# Patient Record
Sex: Female | Born: 1945 | Race: White | Hispanic: No | Smoking: Never smoker
Health system: Southern US, Community
[De-identification: ages and names within clinical notes are randomized; demographics above are authoritative.]

## PROBLEM LIST (undated history)

## (undated) DIAGNOSIS — K219 Gastro-esophageal reflux disease without esophagitis: Secondary | ICD-10-CM

## (undated) DIAGNOSIS — E7800 Pure hypercholesterolemia, unspecified: Secondary | ICD-10-CM

## (undated) DIAGNOSIS — F419 Anxiety disorder, unspecified: Secondary | ICD-10-CM

## (undated) DIAGNOSIS — I10 Essential (primary) hypertension: Secondary | ICD-10-CM

## (undated) DIAGNOSIS — M7541 Impingement syndrome of right shoulder: Secondary | ICD-10-CM

## (undated) HISTORY — PX: TUBAL LIGATION: SHX77

## (undated) HISTORY — PX: BACK SURGERY: SHX140

---

## 1998-11-15 ENCOUNTER — Ambulatory Visit (HOSPITAL_COMMUNITY): Admission: RE | Admit: 1998-11-15 | Discharge: 1998-11-15 | Payer: Self-pay | Admitting: Obstetrics and Gynecology

## 2004-07-18 ENCOUNTER — Encounter: Admission: RE | Admit: 2004-07-18 | Discharge: 2004-07-18 | Payer: Self-pay | Admitting: Orthopedic Surgery

## 2004-09-13 ENCOUNTER — Encounter: Admission: RE | Admit: 2004-09-13 | Discharge: 2004-09-13 | Payer: Self-pay | Admitting: Internal Medicine

## 2005-12-04 ENCOUNTER — Encounter: Admission: RE | Admit: 2005-12-04 | Discharge: 2005-12-04 | Payer: Self-pay | Admitting: Interventional Radiology

## 2007-01-13 ENCOUNTER — Emergency Department (HOSPITAL_COMMUNITY): Admission: EM | Admit: 2007-01-13 | Discharge: 2007-01-13 | Payer: Self-pay | Admitting: Emergency Medicine

## 2007-02-02 ENCOUNTER — Emergency Department (HOSPITAL_COMMUNITY): Admission: EM | Admit: 2007-02-02 | Discharge: 2007-02-02 | Payer: Self-pay | Admitting: Emergency Medicine

## 2007-12-17 ENCOUNTER — Encounter: Admission: RE | Admit: 2007-12-17 | Discharge: 2007-12-17 | Payer: Self-pay | Admitting: Gastroenterology

## 2008-01-14 ENCOUNTER — Encounter: Admission: RE | Admit: 2008-01-14 | Discharge: 2008-01-14 | Payer: Self-pay | Admitting: Gastroenterology

## 2009-01-02 ENCOUNTER — Emergency Department (HOSPITAL_COMMUNITY): Admission: EM | Admit: 2009-01-02 | Discharge: 2009-01-02 | Payer: Self-pay | Admitting: Emergency Medicine

## 2009-03-03 ENCOUNTER — Emergency Department (HOSPITAL_COMMUNITY): Admission: EM | Admit: 2009-03-03 | Discharge: 2009-03-03 | Payer: Self-pay | Admitting: Emergency Medicine

## 2009-05-07 ENCOUNTER — Encounter
Admission: RE | Admit: 2009-05-07 | Discharge: 2009-05-07 | Payer: Self-pay | Admitting: Physical Medicine and Rehabilitation

## 2009-05-25 ENCOUNTER — Emergency Department (HOSPITAL_COMMUNITY): Admission: EM | Admit: 2009-05-25 | Discharge: 2009-05-25 | Payer: Self-pay | Admitting: Emergency Medicine

## 2009-07-13 ENCOUNTER — Ambulatory Visit (HOSPITAL_COMMUNITY): Admission: RE | Admit: 2009-07-13 | Discharge: 2009-07-16 | Payer: Self-pay | Admitting: Orthopedic Surgery

## 2009-07-13 ENCOUNTER — Encounter (INDEPENDENT_AMBULATORY_CARE_PROVIDER_SITE_OTHER): Payer: Self-pay | Admitting: Orthopedic Surgery

## 2010-07-20 ENCOUNTER — Encounter: Payer: Self-pay | Admitting: Orthopedic Surgery

## 2010-10-02 LAB — URINALYSIS, ROUTINE W REFLEX MICROSCOPIC
Nitrite: NEGATIVE
Protein, ur: NEGATIVE mg/dL
Urobilinogen, UA: 0.2 mg/dL (ref 0.0–1.0)

## 2010-10-06 LAB — DIFFERENTIAL
Basophils Absolute: 0 10*3/uL (ref 0.0–0.1)
Basophils Relative: 0 % (ref 0–1)
Eosinophils Absolute: 0.1 K/uL (ref 0.0–0.7)
Eosinophils Relative: 1 % (ref 0–5)
Lymphocytes Relative: 28 % (ref 12–46)
Lymphs Abs: 2.2 K/uL (ref 0.7–4.0)
Monocytes Absolute: 0.5 K/uL (ref 0.1–1.0)
Monocytes Relative: 6 % (ref 3–12)
Neutro Abs: 5.1 10*3/uL (ref 1.7–7.7)
Neutrophils Relative %: 65 % (ref 43–77)

## 2010-10-06 LAB — BASIC METABOLIC PANEL
CO2: 25 mEq/L (ref 19–32)
Calcium: 9.3 mg/dL (ref 8.4–10.5)
Creatinine, Ser: 0.66 mg/dL (ref 0.4–1.2)
GFR calc Af Amer: >60 mL/min (ref >60)
GFR calc non Af Amer: >60 mL/min (ref >60)

## 2010-10-06 LAB — CBC
HCT: 36.3 % (ref 36.0–46.0)
Hemoglobin: 12.2 g/dL (ref 12.0–15.0)
MCHC: 33.7 g/dL (ref 30.0–36.0)
MCV: 92.1 fL (ref 78.0–100.0)
Platelets: 178 K/uL (ref 150–400)
RBC: 3.94 MIL/uL (ref 3.87–5.11)
RDW: 12.8 % (ref 11.5–15.5)
WBC: 7.8 10*3/uL (ref 4.0–10.5)

## 2010-10-06 LAB — BASIC METABOLIC PANEL WITH GFR
BUN: 10 mg/dL (ref 6–23)
Chloride: 102 meq/L (ref 96–112)
Glucose, Bld: 110 mg/dL — ABNORMAL HIGH (ref 70–99)
Potassium: 3.6 meq/L (ref 3.5–5.1)
Sodium: 138 meq/L (ref 135–145)

## 2010-10-06 LAB — POCT CARDIAC MARKERS
CKMB, poc: <1 ng/mL — ABNORMAL LOW (ref 1.0–8.0)
Myoglobin, poc: 43.9 ng/mL (ref 12–200)
Troponin i, poc: <0.05 ng/mL (ref 0.00–0.09)

## 2011-04-14 LAB — DIFFERENTIAL
Basophils Absolute: 0
Basophils Absolute: 0
Basophils Relative: 1
Eosinophils Relative: 2
Lymphocytes Relative: 29
Monocytes Absolute: 0.3
Neutro Abs: 3.7
Neutrophils Relative %: 67

## 2011-04-14 LAB — POCT CARDIAC MARKERS
CKMB, poc: <1 — ABNORMAL LOW
Myoglobin, poc: 42.6

## 2011-04-14 LAB — URINALYSIS, ROUTINE W REFLEX MICROSCOPIC
Bilirubin Urine: NEGATIVE
Glucose, UA: NEGATIVE
Hgb urine dipstick: NEGATIVE
Specific Gravity, Urine: 1.005
Urobilinogen, UA: 0.2

## 2011-04-14 LAB — COMPREHENSIVE METABOLIC PANEL
AST: 18
Albumin: 3.8
Alkaline Phosphatase: 71
Chloride: 105
Creatinine, Ser: 0.71
GFR calc Af Amer: >60
Potassium: 3.8
Total Bilirubin: 0.7

## 2011-04-14 LAB — I-STAT 8, (EC8 V) (CONVERTED LAB)
Chloride: 104
HCT: 37
Hemoglobin: 12.6
Operator id: 198171
Potassium: 3.6

## 2011-04-14 LAB — D-DIMER, QUANTITATIVE: D-Dimer, Quant: 0.35

## 2011-04-14 LAB — CBC
MCHC: 34.4
Platelets: 206
RBC: 3.87
RDW: 12.6
WBC: 5.6

## 2012-06-29 ENCOUNTER — Encounter (HOSPITAL_COMMUNITY): Payer: Self-pay | Admitting: *Deleted

## 2012-06-29 ENCOUNTER — Emergency Department (HOSPITAL_COMMUNITY)
Admission: EM | Admit: 2012-06-29 | Discharge: 2012-06-30 | Disposition: A | Discharge status: Home / Self Care | Payer: BC Managed Care – PPO | Attending: Emergency Medicine | Admitting: Emergency Medicine

## 2012-06-29 DIAGNOSIS — E7800 Pure hypercholesterolemia, unspecified: Secondary | ICD-10-CM | POA: Insufficient documentation

## 2012-06-29 DIAGNOSIS — R110 Nausea: Secondary | ICD-10-CM | POA: Insufficient documentation

## 2012-06-29 DIAGNOSIS — R7309 Other abnormal glucose: Secondary | ICD-10-CM | POA: Insufficient documentation

## 2012-06-29 DIAGNOSIS — Z8659 Personal history of other mental and behavioral disorders: Secondary | ICD-10-CM | POA: Insufficient documentation

## 2012-06-29 DIAGNOSIS — F17200 Nicotine dependence, unspecified, uncomplicated: Secondary | ICD-10-CM | POA: Insufficient documentation

## 2012-06-29 DIAGNOSIS — R739 Hyperglycemia, unspecified: Secondary | ICD-10-CM

## 2012-06-29 DIAGNOSIS — I10 Essential (primary) hypertension: Secondary | ICD-10-CM | POA: Insufficient documentation

## 2012-06-29 DIAGNOSIS — R109 Unspecified abdominal pain: Secondary | ICD-10-CM | POA: Insufficient documentation

## 2012-06-29 DIAGNOSIS — Z8719 Personal history of other diseases of the digestive system: Secondary | ICD-10-CM | POA: Insufficient documentation

## 2012-06-29 HISTORY — DX: Pure hypercholesterolemia, unspecified: E78.00

## 2012-06-29 HISTORY — DX: Essential (primary) hypertension: I10

## 2012-06-29 HISTORY — DX: Gastro-esophageal reflux disease without esophagitis: K21.9

## 2012-06-29 HISTORY — DX: Anxiety disorder, unspecified: F41.9

## 2012-06-29 LAB — URINALYSIS, MICROSCOPIC ONLY
Bilirubin Urine: NEGATIVE
Nitrite: NEGATIVE
Protein, ur: NEGATIVE mg/dL
Specific Gravity, Urine: 1.031 — ABNORMAL HIGH (ref 1.005–1.030)
Urobilinogen, UA: 1 mg/dL (ref 0.0–1.0)

## 2012-06-29 LAB — COMPREHENSIVE METABOLIC PANEL
BUN: 13 mg/dL (ref 6–23)
Calcium: 9.6 mg/dL (ref 8.4–10.5)
Creatinine, Ser: 0.75 mg/dL (ref 0.50–1.10)
GFR calc Af Amer: >90 mL/min (ref >90)
GFR calc non Af Amer: 88 mL/min — ABNORMAL LOW (ref >90)
Glucose, Bld: 181 mg/dL — ABNORMAL HIGH (ref 70–99)
Total Protein: 7.9 g/dL (ref 6.0–8.3)

## 2012-06-29 LAB — CBC WITH DIFFERENTIAL/PLATELET
Eosinophils Absolute: 0.1 10*3/uL (ref 0.0–0.7)
Eosinophils Relative: 1 % (ref 0–5)
HCT: 39 % (ref 36.0–46.0)
Hemoglobin: 13 g/dL (ref 12.0–15.0)
Lymphs Abs: 2.2 10*3/uL (ref 0.7–4.0)
MCH: 30.5 pg (ref 26.0–34.0)
MCV: 91.5 fL (ref 78.0–100.0)
Monocytes Absolute: 0.6 10*3/uL (ref 0.1–1.0)
Monocytes Relative: 6 % (ref 3–12)
Platelets: 209 10*3/uL (ref 150–400)
RBC: 4.26 MIL/uL (ref 3.87–5.11)

## 2012-06-29 LAB — LIPASE, BLOOD: Lipase: 34 U/L (ref 11–59)

## 2012-06-29 LAB — POCT I-STAT TROPONIN I: Troponin i, poc: 0 ng/mL (ref 0.00–0.08)

## 2012-06-29 MED ORDER — ONDANSETRON HCL 4 MG/2ML IJ SOLN
4.0000 mg | Freq: Once | INTRAMUSCULAR | Status: AC
  Administered 2012-06-30: 4 mg via INTRAVENOUS
  Filled 2012-06-29: qty 2

## 2012-06-29 MED ORDER — SODIUM CHLORIDE 0.9 % IV SOLN
1000.0000 mL | INTRAVENOUS | Status: DC
  Administered 2012-06-30: 1000 mL via INTRAVENOUS

## 2012-06-29 MED ORDER — SODIUM CHLORIDE 0.9 % IV SOLN
1000.0000 mL | Freq: Once | INTRAVENOUS | Status: AC
  Administered 2012-06-30: 1000 mL via INTRAVENOUS

## 2012-06-29 MED ORDER — HYDROMORPHONE HCL PF 1 MG/ML IJ SOLN
0.5000 mg | Freq: Once | INTRAMUSCULAR | Status: AC
  Administered 2012-06-30: 0.5 mg via INTRAVENOUS
  Filled 2012-06-29: qty 1

## 2012-06-29 NOTE — ED Notes (Signed)
Reports severe upper abd pain x 1 [place], has waves of nausea. Denies any vomiting. Reports increase in belching, abd bloating and last bm was this am.

## 2012-06-30 ENCOUNTER — Emergency Department (HOSPITAL_COMMUNITY): Payer: BC Managed Care – PPO

## 2012-06-30 MED ORDER — ONDANSETRON HCL 4 MG PO TABS: 4.0000 mg | ORAL_TABLET | Freq: Four times a day (QID) | ORAL | Status: DC

## 2012-06-30 MED ORDER — HYDROMORPHONE HCL PF 1 MG/ML IJ SOLN
1.0000 mg | Freq: Once | INTRAMUSCULAR | Status: AC
  Administered 2012-06-30: 1 mg via INTRAVENOUS
  Filled 2012-06-30: qty 1

## 2012-06-30 MED ORDER — ONDANSETRON HCL 4 MG/2ML IJ SOLN
INTRAMUSCULAR | Status: AC
  Filled 2012-06-30: qty 2

## 2012-06-30 MED ORDER — ONDANSETRON HCL 4 MG/2ML IJ SOLN
4.0000 mg | Freq: Once | INTRAMUSCULAR | Status: AC
  Administered 2012-06-30: 4 mg via INTRAVENOUS

## 2012-06-30 NOTE — ED Notes (Signed)
Patient transported to Ultrasound 

## 2012-06-30 NOTE — ED Provider Notes (Signed)
History     CSN: [id number]  Arrival date & time 06/29/12  1818   First MD Initiated Contact with Patient 06/29/12 2319      Chief Complaint  Patient presents with  . Abdominal Pain    (Consider location/radiation/quality/duration/timing/severity/associated sxs/prior treatment) HPI 67 year old female presents emergency department complaining of upper abdominal pain starting yesterday and worsening throughout the night and [place] today. She has had nausea without vomiting. She denies any fevers. No prior history of similar pain. Pain has occasionally radiated into her shoulder blades. It is mainly in the upper abdomen. She has had bloating associated with this. No prior abdominal surgeries. Past Medical History  Diagnosis Date  . Acid reflux   . High cholesterol   . Anxiety   . Hypertension     Past Surgical History  Procedure Date  . Back surgery     History reviewed. No pertinent family history.  History  Substance Use Topics  . Smoking status: Current Some Ertel Smoker    Types: Cigarettes  . Smokeless tobacco: Not on file  . Alcohol Use: No    OB History    Grav Para Term Preterm Abortions TAB SAB Ect Mult Living                  Review of Systems  See History of Present Illness; otherwise all other systems are reviewed and negative Allergies  Morphine and related  Home Medications  No current outpatient prescriptions on file.  BP 140/50  Pulse 77  Temp 99.3 F (37.4 C) (Oral)  Resp 20  SpO2 98%  Physical Exam  Nursing note and vitals reviewed. Constitutional: She is oriented to person, place, and time. She appears well-developed and well-nourished. She appears distressed (uncomfortable appearing).  HENT:  Head: Normocephalic and atraumatic.  Nose: Nose normal.  Mouth/Throat: Oropharynx is clear and moist.  Eyes: Conjunctivae normal and EOM are normal. Pupils are equal, round, and reactive to light.  Neck: Normal range of motion. Neck supple. No JVD  present. No tracheal deviation present. No thyromegaly present.  Cardiovascular: Normal rate, regular rhythm, normal heart sounds and intact distal pulses.  Exam reveals no gallop and no friction rub.   No murmur heard. Pulmonary/Chest: Effort normal and breath sounds normal. No stridor. No respiratory distress. She has no wheezes. She has no rales. She exhibits no tenderness.  Abdominal: Soft. She exhibits distension. She exhibits no mass. There is tenderness (tenderness throughout upper abdomen mainly in right upper quadrant with positive Murphy sign). There is no rebound and no guarding.       Hyperactive bowel sounds  Musculoskeletal: Normal range of motion. She exhibits no edema and no tenderness.  Lymphadenopathy:    She has no cervical adenopathy.  Neurological: She is alert and oriented to person, place, and time. She exhibits normal muscle tone. Coordination normal.  Skin: Skin is warm and dry. No rash noted. No erythema. No pallor.  Psychiatric: She has a normal mood and affect. Her behavior is normal. Judgment and thought content normal.    ED Course  Procedures (including critical care time)  Labs Reviewed  COMPREHENSIVE METABOLIC PANEL - Abnormal; Notable for the following:    Glucose, Bld 181 (*)     GFR calc non Af Amer 88 (*)     All other components within normal limits  URINALYSIS, MICROSCOPIC ONLY - Abnormal; Notable for the following:    Specific Gravity, Urine 1.031 (*)     Glucose, UA >1000 (*)  Ketones, ur 15 (*)     Bacteria, UA FEW (*)     All other components within normal limits  CBC WITH DIFFERENTIAL  LIPASE, BLOOD  POCT I-STAT TROPONIN I  URINE CULTURE   US Abdomen Complete  06/30/2012  *RADIOLOGY REPORT*  Clinical Data:  Abdominal pain.  ABDOMINAL ULTRASOUND COMPLETE  Comparison:  CT of the abdomen and pelvis performed 05/25/2009, and abdominal ultrasound performed 12/17/2007  Findings:  Gallbladder:  The gallbladder is normal in appearance, without  evidence for gallstones, gallbladder wall thickening or pericholecystic fluid.  No ultrasonographic Murphy's sign is elicited.  Common Bile Duct:  0.3 cm in diameter; within normal limits in caliber.  Liver:  Diffusely increased echogenicity and coarsened echotexture, compatible with fatty infiltration; no focal lesions identified. Limited Doppler evaluation demonstrates normal blood flow within the liver.  Difficult to fully characterize due to overlying structures.  IVC:  Unremarkable in appearance.  Pancreas:  Although the pancreas is difficult to visualize in its entirety due to overlying bowel gas, no focal pancreatic abnormality is identified.  Spleen:  9.9 cm in length; within normal limits in size and echotexture.  Right kidney:  11.2 cm in length; normal in size, configuration and parenchymal echogenicity.  Mild renal cortical thinning is noted. No evidence of mass or hydronephrosis.  Left kidney:  12.2 cm in length; normal in size, configuration and parenchymal echogenicity.  No evidence of mass or hydronephrosis.  Abdominal Aorta:  Normal in caliber; no aneurysm identified.  IMPRESSION:  1.  No acute abnormality seen within the abdomen. 2.  Fatty infiltration within the liver. 3.  Mild right renal cortical thinning may reflect chronic renal disease.   Original Report Authenticated By: Tonia Ghent, M.D.      1. Abdominal pain of unknown etiology   2. Hyperglycemia       MDM  67 year old female with upper abdominal pain. Concern for cholelithiasis. We'll get ultrasound. Patient informed that her blood sugar today is 181. She denies previous history of diabetes. Will have her followup with her primary care Dr. for recheck.        Olivia Mackie, MD 06/30/12 [phone]

## 2012-07-01 LAB — URINE CULTURE: Colony Count: 10000

## 2013-06-03 ENCOUNTER — Other Ambulatory Visit: Payer: Self-pay | Admitting: Oral Surgery

## 2013-08-12 ENCOUNTER — Other Ambulatory Visit: Payer: Self-pay | Admitting: Orthopedic Surgery

## 2013-08-16 ENCOUNTER — Encounter (HOSPITAL_COMMUNITY): Payer: Self-pay | Admitting: Pharmacy Technician

## 2013-08-16 ENCOUNTER — Other Ambulatory Visit: Payer: Self-pay | Admitting: Orthopedic Surgery

## 2013-08-16 NOTE — H&P (Signed)
Summer BoozerGail B. Thompson  DOB: 12-06-1945 Single / Language: Lenox Ponds[place] / Race: White Female  Date of Admission:  08-22-2013  Chief Complaint:  Right Knee Pain  History of Present Illness The patient is a 68 year old female who comes in for a preoperative History and Physical. The patient is scheduled for a right total knee arthroplasty to be performed by Dr. Gus RankinFrank V. Aluisio, MD at [facility] on 08-22-2013. The patient is a 68 year old female who presents with knee complaints. The patient reports right knee symptoms including: pain and swelling which began year(s) ago without any known injury. Note for "Knee pain": She last seen Dr Rennis ChrisSupple in 2012. She had visco injections done at that time that did help some. She was told then she would need a total knee replacement. She wanted to discuss surgery when she came in. Unfortunately, her right knee continues to worsen. It is bothering her at all times. It's preventing her from doing what she desires. She can generally sleep well at night unless she's had a really busy [place]. She is not getting any locking or catching. She is not having any instability symptoms. She just has pain with just about everything she does.  She is now ready to proceed with knee replacement. They have been treated conservatively in the past for the above stated problem and despite conservative measures, they continue to have progressive pain and severe functional limitations and dysfunction. They have failed non-operative management including home exercise, medications, and injections. It is felt that they would benefit from undergoing total joint replacement. Risks and benefits of the procedure have been discussed with the patient and they elect to proceed with surgery. There are no active contraindications to surgery such as ongoing infection or rapidly progressive neurological disease.   Allergies CODEINE. 05/17/2009 Nausea. TALWIN NX. 07/09/2009 Vomiting. DILAUDID.  11/26/2010 Itching.  Problem List/Past Medical Impingement syndrome, shoulder (726.2). Right Shoulder Primary osteoarthritis of one knee (715.16). Right Knee Anxiety Disorder Hypercholesterolemia High blood pressure Menopause Measles Mumps   Family History Hypertension. mother and sister Heart disease in female family member before age 66 Severe allergy. brother Diabetes Mellitus. mother and grandmother mothers side Cancer. father, brother and grandfather mothers side Congestive Heart Failure. mother and grandmother mothers side Cerebrovascular Accident. mother Heart Disease. First Degree Relatives. sister    Social History Tobacco use. Former smoker. former smoker; smoke(d) less than 1/2 pack(s) per [place] Tobacco / smoke exposure. no Previously in rehab. no Pain Contract. no Number of flights of stairs before winded. greater than 5 Most recent primary occupation. dental office Marital status. divorced Current work status. working full time Illicit drug use. no Exercise. Exercises never Living situation. live with partner Children. 3 Alcohol use. Occasional alcohol use. current drinker; only occasionally per week Drug/Alcohol Rehab (Currently). no Advance Directives. Living Will, Healthcare POA Post-Surgical Plans. [place] [facility]    Medication History Omeprazole (20MG  Capsule DR, Oral) Active. FLUoxetine HCl (20MG  Capsule, Oral) Active. Losartan Potassium-HCTZ ( Oral) Specific dose unknown - Active. Simvastatin ( Oral) Specific dose unknown - Active. Xanax ( Oral) Specific dose unknown - Active. Oxybutynin Chloride ( Oral) Specific dose unknown - Active. Aspirin Childrens (81MG  Tablet Chewable, Oral) Active.   Past Surgical History Tubal Ligation Spinal Surgery   Review of Systems General:Not Present- Chills, Fever, Night Sweats, Fatigue, Weight Gain, Weight Loss and Memory Loss. Skin:Not Present- Hives, Itching, Rash,  Eczema and Lesions. HEENT:Not Present- Tinnitus, Headache, Double Vision, Visual Loss, Hearing Loss and Dentures. Respiratory:Not  Present- Shortness of breath with exertion, Shortness of breath at rest, Allergies, Coughing up blood and Chronic Cough. Cardiovascular:Not Present- Chest Pain, Racing/skipping heartbeats, Difficulty Breathing Lying Down, Murmur, Swelling and Palpitations. Gastrointestinal:Not Present- Bloody Stool, Heartburn, Abdominal Pain, Vomiting, Nausea, Constipation, Diarrhea, Difficulty Swallowing, Jaundice and Loss of appetitie. Female Genitourinary:Not Present- Blood in Urine, Urinary frequency, Weak urinary stream, Discharge, Flank Pain, Incontinence, Painful Urination, Urgency, Urinary Retention and Urinating at Night. Musculoskeletal:Present- Joint Pain. Not Present- Muscle Weakness, Muscle Pain, Joint Swelling, Back Pain, Morning Stiffness and Spasms. Neurological:Not Present- Tremor, Dizziness, Blackout spells, Paralysis, Difficulty with balance and Weakness. Psychiatric:Not Present- Insomnia.    Vitals Pulse: 64 (Regular) Resp.: 14 (Unlabored) BP: 142/62 (Sitting, Right Arm, Standard)     Physical Exam The physical exam findings are as follows:   General Mental Status - Alert, cooperative and good historian. General Appearance- pleasant. Not in acute distress. Orientation- Oriented X3. Build & Nutrition- Well nourished and Well developed.   Head and Neck Head- normocephalic, atraumatic . Neck Global Assessment- supple. no bruit auscultated on the right and no bruit auscultated on the left. lower partial denture plate  Eye Pupil- Bilateral- Regular and Round. Motion- Bilateral- EOMI.   Chest and Lung Exam Auscultation: Breath sounds:- clear at anterior chest wall and - clear at posterior chest wall. Adventitious sounds:- No Adventitious sounds.   Cardiovascular Auscultation:Rhythm- Regular rate and  rhythm. Heart Sounds- S1 WNL and S2 WNL. Murmurs & Other Heart Sounds:Auscultation of the heart reveals - No Murmurs.   Abdomen Palpation/Percussion:Tenderness- Abdomen is non-tender to palpation. Rigidity (guarding)- Abdomen is soft. Auscultation:Auscultation of the abdomen reveals - Bowel sounds normal.   Female Genitourinary   Note: Not done, not pertinent to present illness  Musculoskeletal  Well developed female in no distress. Evaluation of her right knee shows no effusion. She has about a 15 degree flexion contracture and can flex to 95. She has a slight varus deformity. She has marked crepitus on range of motion with tenderness medial greater than lateral. No instability noted. Left knee shows varus deformity, no effusion. Range is about 5 to 125. She has significant varus/valgus laxity as well as AP laxity. Pulses, sensation and motor are intact in both lower extremities.  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 She is in about 15-20 degrees of varus with that knee. Her right knee shows bone on bone arthritis, tricompartmental with a varus deformity.   Assessment & Plan Primary osteoarthritis of one knee (715.16) Impression: Right Knee  Note: Plan is for a Right Total Knee Replacement by Dr. Lequita Halt.  Plan is to go to [facility].  The patient does not have any contraindications and will receive TXA (tranexamic acid) prior to surgery.  Signed electronically by Lauraine Rinne, III PA-C

## 2013-08-18 ENCOUNTER — Other Ambulatory Visit (HOSPITAL_COMMUNITY): Payer: Self-pay | Admitting: Orthopedic Surgery

## 2013-08-18 NOTE — Patient Instructions (Addendum)
20 Summer [name]  08/18/2013   Your procedure is scheduled on: Monday February 23rd  Report to [facility] at 835 AM.  Call this number if you have problems the morning of surgery [phone]   Remember:  Do not eat food or drink liquids :After Midnight.     Take these medicines the morning of surgery with A SIP OF WATER: xanax if needed, prozac, prilosec, simvastatin                                SEE [place] PREPARING FOR SURGERY SHEET             You may not have any metal on your body including hair pins and piercings  Do not wear jewelry, make-up.  Do not wear lotions, powders, or perfumes. No  Deodorant is to be worn.   Men may shave face and neck.  Do not bring valuables to the hospital. [facility] IS NOT RESPONSIBLE FOR VALUEABLES.  Contacts, dentures or bridgework may not be worn into surgery.  Leave suitcase in the car. After surgery it may be brought to your room.  For patients admitted to the hospital, checkout time is 11:00 AM the [place] of discharge.   Patients discharged the [place] of surgery will not be allowed to drive home.  Name and phone number of your driver:  Special Instructions: N/A  Please read over the following fact sheets that you were given: [facility] Preparing for surgery sheet, MRSA information, incentive spirometer sheet, blood fact sheet     FAILURE TO FOLLOW THESE INSTRUCTIONS MAY RESULT IN THE CANCELLATION OF YOUR SURGERY.  PATIENT SIGNATURE___________________________________________  NURSE SIGNATURE_____________________________________________

## 2013-08-19 ENCOUNTER — Encounter (HOSPITAL_COMMUNITY): Payer: Self-pay

## 2013-08-19 ENCOUNTER — Ambulatory Visit (HOSPITAL_COMMUNITY)
Admission: RE | Admit: 2013-08-19 | Discharge: 2013-08-19 | Disposition: A | Discharge status: Home / Self Care | Payer: Medicare Other | Source: Ambulatory Visit | Attending: Orthopedic Surgery | Admitting: Orthopedic Surgery

## 2013-08-19 ENCOUNTER — Encounter (HOSPITAL_COMMUNITY)
Admission: RE | Admit: 2013-08-19 | Discharge: 2013-08-19 | Disposition: A | Discharge status: Home / Self Care | Payer: Medicare Other | Source: Ambulatory Visit | Attending: Orthopedic Surgery | Admitting: Orthopedic Surgery

## 2013-08-19 DIAGNOSIS — Z01812 Encounter for preprocedural laboratory examination: Secondary | ICD-10-CM | POA: Insufficient documentation

## 2013-08-19 DIAGNOSIS — M4120 Other idiopathic scoliosis, site unspecified: Secondary | ICD-10-CM | POA: Insufficient documentation

## 2013-08-19 DIAGNOSIS — Z01818 Encounter for other preprocedural examination: Secondary | ICD-10-CM | POA: Insufficient documentation

## 2013-08-19 HISTORY — DX: Impingement syndrome of right shoulder: M75.41

## 2013-08-19 LAB — COMPREHENSIVE METABOLIC PANEL
ALT: 25 U/L (ref 0–35)
AST: 24 U/L (ref 0–37)
Albumin: 3.9 g/dL (ref 3.5–5.2)
Alkaline Phosphatase: 62 U/L (ref 39–117)
BILIRUBIN TOTAL: 0.5 mg/dL (ref 0.3–1.2)
BUN: 11 mg/dL (ref 6–23)
CALCIUM: 9.5 mg/dL (ref 8.4–10.5)
CHLORIDE: 98 meq/L (ref 96–112)
CO2: 30 mEq/L (ref 19–32)
Creatinine, Ser: 0.69 mg/dL (ref 0.50–1.10)
GFR calc Af Amer: >90 mL/min (ref >90)
GFR, EST NON AFRICAN AMERICAN: 89 mL/min — AB (ref >90)
Glucose, Bld: 94 mg/dL (ref 70–99)
Potassium: 4.3 mEq/L (ref 3.7–5.3)
Sodium: 138 mEq/L (ref 137–147)
Total Protein: 7.7 g/dL (ref 6.0–8.3)

## 2013-08-19 LAB — CBC
HEMATOCRIT: 37.2 % (ref 36.0–46.0)
Hemoglobin: 12.4 g/dL (ref 12.0–15.0)
MCH: 30.4 pg (ref 26.0–34.0)
MCHC: 33.3 g/dL (ref 30.0–36.0)
MCV: 91.2 fL (ref 78.0–100.0)
Platelets: 191 10*3/uL (ref 150–400)
RBC: 4.08 MIL/uL (ref 3.87–5.11)
RDW: 12.1 % (ref 11.5–15.5)
WBC: 5.8 10*3/uL (ref 4.0–10.5)

## 2013-08-19 LAB — URINALYSIS, ROUTINE W REFLEX MICROSCOPIC
Bilirubin Urine: NEGATIVE
GLUCOSE, UA: NEGATIVE mg/dL
Hgb urine dipstick: NEGATIVE
KETONES UR: NEGATIVE mg/dL
Leukocytes, UA: NEGATIVE
Nitrite: NEGATIVE
PH: 6.5 (ref 5.0–8.0)
Protein, ur: NEGATIVE mg/dL
Specific Gravity, Urine: 1.022 (ref 1.005–1.030)
Urobilinogen, UA: 0.2 mg/dL (ref 0.0–1.0)

## 2013-08-19 LAB — SURGICAL PCR SCREEN
MRSA, PCR: NEGATIVE
STAPHYLOCOCCUS AUREUS: NEGATIVE

## 2013-08-19 LAB — APTT: APTT: 32 s (ref 24–37)

## 2013-08-19 LAB — PROTIME-INR
INR: 1 (ref 0.00–1.49)
Prothrombin Time: 13 seconds (ref 11.6–15.2)

## 2013-08-19 LAB — ABO/RH: ABO/RH(D): O POS

## 2013-08-19 NOTE — Progress Notes (Signed)
Medical clearance note dr Michiel Cowboyvelaquez 07-29-13 on chart Cardiac clearance note 08-19-13 dr Jacinto Halimganji on chart Echo 08-11-13 dr Jacinto Halimganji on chart ekg 08-09-13 ekg dr Jacinto Halimganji on chart Stress test 08-15-13 dr Jacinto Halimganji on chart

## 2013-08-19 NOTE — Progress Notes (Signed)
08/19/13 1318  OBSTRUCTIVE SLEEP APNEA  Have you ever been diagnosed with sleep apnea through a sleep study? No (pt told by dr Jacinto Halimganji needs sleep study due to decreasing oxygen levels not done yet)  Do you snore loudly (loud enough to be heard through closed doors)?  1  Do you often feel tired, fatigued, or sleepy during the daytime? 1  Has anyone observed you stop breathing during your sleep? 0  Do you have, or are you being treated for high blood pressure? 1  BMI more than 35 kg/m2? 0  Age over 66 years old? 1  Neck circumference greater than 40 cm/18 inches? 0  Gender: 0  Obstructive Sleep Apnea Score 4  Score 4 or greater  Results sent to PCP

## 2013-08-22 ENCOUNTER — Encounter (HOSPITAL_COMMUNITY): Payer: Medicare Other | Admitting: Anesthesiology

## 2013-08-22 ENCOUNTER — Encounter (HOSPITAL_COMMUNITY)
Admission: RE | Disposition: A | Discharge status: Skilled Nursing Facility | Payer: Self-pay | Source: Ambulatory Visit | Attending: Orthopedic Surgery

## 2013-08-22 ENCOUNTER — Encounter (HOSPITAL_COMMUNITY): Payer: Self-pay | Admitting: *Deleted

## 2013-08-22 ENCOUNTER — Inpatient Hospital Stay (HOSPITAL_COMMUNITY)
Admission: RE | Admit: 2013-08-22 | Discharge: 2013-08-26 | DRG: 470 | Disposition: A | Discharge status: Skilled Nursing Facility | Payer: Medicare Other | Source: Ambulatory Visit | Attending: Orthopedic Surgery | Admitting: Orthopedic Surgery

## 2013-08-22 ENCOUNTER — Inpatient Hospital Stay (HOSPITAL_COMMUNITY): Payer: Medicare Other | Admitting: Anesthesiology

## 2013-08-22 DIAGNOSIS — R51 Headache: Secondary | ICD-10-CM | POA: Diagnosis present

## 2013-08-22 DIAGNOSIS — E7800 Pure hypercholesterolemia, unspecified: Secondary | ICD-10-CM | POA: Diagnosis present

## 2013-08-22 DIAGNOSIS — T50995A Adverse effect of other drugs, medicaments and biological substances, initial encounter: Secondary | ICD-10-CM | POA: Diagnosis not present

## 2013-08-22 DIAGNOSIS — M1710 Unilateral primary osteoarthritis, unspecified knee: Principal | ICD-10-CM | POA: Diagnosis present

## 2013-08-22 DIAGNOSIS — L299 Pruritus, unspecified: Secondary | ICD-10-CM | POA: Diagnosis not present

## 2013-08-22 DIAGNOSIS — F411 Generalized anxiety disorder: Secondary | ICD-10-CM | POA: Diagnosis present

## 2013-08-22 DIAGNOSIS — M179 Osteoarthritis of knee, unspecified: Secondary | ICD-10-CM | POA: Diagnosis present

## 2013-08-22 DIAGNOSIS — Z96651 Presence of right artificial knee joint: Secondary | ICD-10-CM

## 2013-08-22 DIAGNOSIS — I10 Essential (primary) hypertension: Secondary | ICD-10-CM | POA: Diagnosis present

## 2013-08-22 DIAGNOSIS — Z8249 Family history of ischemic heart disease and other diseases of the circulatory system: Secondary | ICD-10-CM

## 2013-08-22 DIAGNOSIS — Z6834 Body mass index (BMI) 34.0-34.9, adult: Secondary | ICD-10-CM

## 2013-08-22 DIAGNOSIS — K219 Gastro-esophageal reflux disease without esophagitis: Secondary | ICD-10-CM | POA: Diagnosis present

## 2013-08-22 HISTORY — PX: TOTAL KNEE ARTHROPLASTY: SHX125

## 2013-08-22 LAB — TYPE AND SCREEN
ABO/RH(D): O POS
Antibody Screen: NEGATIVE

## 2013-08-22 SURGERY — ARTHROPLASTY, KNEE, TOTAL
Anesthesia: Spinal | Site: Knee | Laterality: Right

## 2013-08-22 MED ORDER — RIVAROXABAN 10 MG PO TABS
10.0000 mg | ORAL_TABLET | Freq: Every day | ORAL | Status: DC
  Administered 2013-08-23 – 2013-08-26 (×4): 10 mg via ORAL
  Filled 2013-08-22 (×5): qty 1

## 2013-08-22 MED ORDER — PROPOFOL 10 MG/ML IV BOLUS
INTRAVENOUS | Status: AC
  Filled 2013-08-22: qty 20

## 2013-08-22 MED ORDER — MIDAZOLAM HCL 5 MG/5ML IJ SOLN
INTRAMUSCULAR | Status: DC | PRN
  Administered 2013-08-22: 2 mg via INTRAVENOUS

## 2013-08-22 MED ORDER — CEFAZOLIN SODIUM-DEXTROSE 2-3 GM-% IV SOLR
2.0000 g | INTRAVENOUS | Status: AC
  Administered 2013-08-22: 2 g via INTRAVENOUS

## 2013-08-22 MED ORDER — ACETAMINOPHEN 500 MG PO TABS
1000.0000 mg | ORAL_TABLET | Freq: Four times a day (QID) | ORAL | Status: AC
  Administered 2013-08-22 – 2013-08-23 (×4): 1000 mg via ORAL
  Filled 2013-08-22 (×6): qty 2

## 2013-08-22 MED ORDER — CEFAZOLIN SODIUM-DEXTROSE 2-3 GM-% IV SOLR
2.0000 g | Freq: Four times a day (QID) | INTRAVENOUS | Status: AC
  Administered 2013-08-22 – 2013-08-23 (×2): 2 g via INTRAVENOUS
  Filled 2013-08-22 (×3): qty 50

## 2013-08-22 MED ORDER — MENTHOL 3 MG MT LOZG
1.0000 | LOZENGE | OROMUCOSAL | Status: DC | PRN
  Filled 2013-08-22: qty 9

## 2013-08-22 MED ORDER — ONDANSETRON HCL 4 MG PO TABS: 4.0000 mg | ORAL_TABLET | Freq: Four times a day (QID) | ORAL | Status: DC | PRN

## 2013-08-22 MED ORDER — ONDANSETRON HCL 4 MG/2ML IJ SOLN
4.0000 mg | Freq: Four times a day (QID) | INTRAMUSCULAR | Status: DC | PRN
  Filled 2013-08-22: qty 2

## 2013-08-22 MED ORDER — HYDROCHLOROTHIAZIDE 25 MG PO TABS
25.0000 mg | ORAL_TABLET | Freq: Every day | ORAL | Status: DC
  Filled 2013-08-22: qty 1

## 2013-08-22 MED ORDER — IRBESARTAN 150 MG PO TABS
150.0000 mg | ORAL_TABLET | Freq: Every day | ORAL | Status: DC
  Filled 2013-08-22: qty 1

## 2013-08-22 MED ORDER — ACETAMINOPHEN 10 MG/ML IV SOLN
1000.0000 mg | Freq: Once | INTRAVENOUS | Status: DC
  Filled 2013-08-22: qty 100

## 2013-08-22 MED ORDER — PANTOPRAZOLE SODIUM 40 MG PO TBEC
80.0000 mg | DELAYED_RELEASE_TABLET | Freq: Every day | ORAL | Status: DC
  Administered 2013-08-23: 80 mg via ORAL
  Filled 2013-08-22: qty 2

## 2013-08-22 MED ORDER — BUPIVACAINE HCL 0.25 % IJ SOLN
INTRAMUSCULAR | Status: DC | PRN
Start: 2013-08-22 — End: 2013-08-22
  Administered 2013-08-22: 20 mL

## 2013-08-22 MED ORDER — FLUOXETINE HCL 20 MG PO CAPS
20.0000 mg | ORAL_CAPSULE | Freq: Every day | ORAL | Status: DC
  Administered 2013-08-23 – 2013-08-26 (×4): 20 mg via ORAL
  Filled 2013-08-22 (×4): qty 1

## 2013-08-22 MED ORDER — TRAMADOL HCL 50 MG PO TABS
50.0000 mg | ORAL_TABLET | Freq: Four times a day (QID) | ORAL | Status: DC | PRN
  Administered 2013-08-22: 100 mg via ORAL
  Administered 2013-08-25: 50 mg via ORAL
  Filled 2013-08-22: qty 1
  Filled 2013-08-22 (×2): qty 2

## 2013-08-22 MED ORDER — ACETAMINOPHEN 325 MG PO TABS
650.0000 mg | ORAL_TABLET | Freq: Four times a day (QID) | ORAL | Status: DC | PRN
  Administered 2013-08-25 – 2013-08-26 (×3): 650 mg via ORAL
  Filled 2013-08-22 (×3): qty 2

## 2013-08-22 MED ORDER — CEFAZOLIN SODIUM-DEXTROSE 2-3 GM-% IV SOLR
INTRAVENOUS | Status: AC
  Filled 2013-08-22: qty 50

## 2013-08-22 MED ORDER — METHOCARBAMOL 100 MG/ML IJ SOLN
500.0000 mg | Freq: Four times a day (QID) | INTRAVENOUS | Status: DC | PRN
  Filled 2013-08-22: qty 5

## 2013-08-22 MED ORDER — PROPOFOL 10 MG/ML IV BOLUS
INTRAVENOUS | Status: DC | PRN
  Administered 2013-08-22 (×2): 20 mg via INTRAVENOUS

## 2013-08-22 MED ORDER — PROPOFOL INFUSION 10 MG/ML OPTIME
INTRAVENOUS | Status: DC | PRN
  Administered 2013-08-22: 75 ug/kg/min via INTRAVENOUS

## 2013-08-22 MED ORDER — 0.9 % SODIUM CHLORIDE (POUR BTL) OPTIME
TOPICAL | Status: DC | PRN
  Administered 2013-08-22: 1000 mL

## 2013-08-22 MED ORDER — DIPHENHYDRAMINE HCL 12.5 MG/5ML PO ELIX: 12.5000 mg | ORAL_SOLUTION | ORAL | Status: DC | PRN

## 2013-08-22 MED ORDER — DEXAMETHASONE 6 MG PO TABS
10.0000 mg | ORAL_TABLET | Freq: Every day | ORAL | Status: AC
  Administered 2013-08-23: 10 mg via ORAL
  Filled 2013-08-22: qty 1

## 2013-08-22 MED ORDER — DOCUSATE SODIUM 100 MG PO CAPS
100.0000 mg | ORAL_CAPSULE | Freq: Two times a day (BID) | ORAL | Status: DC
  Administered 2013-08-22 – 2013-08-26 (×8): 100 mg via ORAL

## 2013-08-22 MED ORDER — DEXAMETHASONE SODIUM PHOSPHATE 10 MG/ML IJ SOLN
INTRAMUSCULAR | Status: AC
  Filled 2013-08-22: qty 1

## 2013-08-22 MED ORDER — SODIUM CHLORIDE 0.9 % IJ SOLN
INTRAMUSCULAR | Status: DC | PRN
  Administered 2013-08-22: 30 mL

## 2013-08-22 MED ORDER — MIDAZOLAM HCL 2 MG/2ML IJ SOLN
INTRAMUSCULAR | Status: AC
  Filled 2013-08-22: qty 2

## 2013-08-22 MED ORDER — DEXAMETHASONE SODIUM PHOSPHATE 10 MG/ML IJ SOLN
INTRAMUSCULAR | Status: DC | PRN
  Administered 2013-08-22: 10 mg via INTRAVENOUS

## 2013-08-22 MED ORDER — SODIUM CHLORIDE 0.9 % IV SOLN: INTRAVENOUS | Status: DC

## 2013-08-22 MED ORDER — BUPIVACAINE LIPOSOME 1.3 % IJ SUSP
INTRAMUSCULAR | Status: DC | PRN
  Administered 2013-08-22: 20 mL

## 2013-08-22 MED ORDER — OXYBUTYNIN CHLORIDE 5 MG PO TABS
5.0000 mg | ORAL_TABLET | Freq: Two times a day (BID) | ORAL | Status: DC
  Administered 2013-08-22 – 2013-08-26 (×8): 5 mg via ORAL
  Filled 2013-08-22 (×10): qty 1

## 2013-08-22 MED ORDER — METOCLOPRAMIDE HCL 10 MG PO TABS: 5.0000 mg | ORAL_TABLET | Freq: Three times a day (TID) | ORAL | Status: DC | PRN

## 2013-08-22 MED ORDER — BUPIVACAINE HCL (PF) 0.25 % IJ SOLN
INTRAMUSCULAR | Status: AC
  Filled 2013-08-22: qty 30

## 2013-08-22 MED ORDER — PHENOL 1.4 % MT LIQD: 1.0000 | OROMUCOSAL | Status: DC | PRN

## 2013-08-22 MED ORDER — POLYETHYLENE GLYCOL 3350 17 G PO PACK
17.0000 g | PACK | Freq: Every day | ORAL | Status: DC | PRN
  Administered 2013-08-25: 17 g via ORAL

## 2013-08-22 MED ORDER — ALPRAZOLAM 0.25 MG PO TABS
0.1250 mg | ORAL_TABLET | Freq: Every day | ORAL | Status: DC | PRN
  Administered 2013-08-25: 0.25 mg via ORAL
  Filled 2013-08-22: qty 1

## 2013-08-22 MED ORDER — BUPIVACAINE IN DEXTROSE 0.75-8.25 % IT SOLN
INTRATHECAL | Status: DC | PRN
  Administered 2013-08-22: 2 mL via INTRATHECAL

## 2013-08-22 MED ORDER — DEXAMETHASONE SODIUM PHOSPHATE 10 MG/ML IJ SOLN
10.0000 mg | Freq: Every day | INTRAMUSCULAR | Status: AC
Start: 2013-08-23 — End: 2013-08-23
  Filled 2013-08-22: qty 1

## 2013-08-22 MED ORDER — TRANEXAMIC ACID 100 MG/ML IV SOLN
1000.0000 mg | INTRAVENOUS | Status: AC
  Administered 2013-08-22: 1000 mg via INTRAVENOUS
  Filled 2013-08-22: qty 10

## 2013-08-22 MED ORDER — ACETAMINOPHEN 650 MG RE SUPP: 650.0000 mg | Freq: Four times a day (QID) | RECTAL | Status: DC | PRN

## 2013-08-22 MED ORDER — PROMETHAZINE HCL 25 MG/ML IJ SOLN: 6.2500 mg | INTRAMUSCULAR | Status: DC | PRN

## 2013-08-22 MED ORDER — HYDROMORPHONE HCL PF 1 MG/ML IJ SOLN: 0.2500 mg | INTRAMUSCULAR | Status: DC | PRN

## 2013-08-22 MED ORDER — MEPERIDINE HCL 50 MG/ML IJ SOLN: 6.2500 mg | INTRAMUSCULAR | Status: DC | PRN

## 2013-08-22 MED ORDER — SODIUM CHLORIDE 0.9 % IJ SOLN
INTRAMUSCULAR | Status: AC
  Filled 2013-08-22: qty 50

## 2013-08-22 MED ORDER — METOCLOPRAMIDE HCL 5 MG/ML IJ SOLN: 5.0000 mg | Freq: Three times a day (TID) | INTRAMUSCULAR | Status: DC | PRN

## 2013-08-22 MED ORDER — HYDROMORPHONE HCL PF 1 MG/ML IJ SOLN
0.5000 mg | INTRAMUSCULAR | Status: DC | PRN
  Administered 2013-08-23 – 2013-08-24 (×4): 1 mg via INTRAVENOUS
  Filled 2013-08-22 (×4): qty 1

## 2013-08-22 MED ORDER — LACTATED RINGERS IV SOLN
INTRAVENOUS | Status: DC
  Administered 2013-08-22 (×3): via INTRAVENOUS

## 2013-08-22 MED ORDER — BUPIVACAINE LIPOSOME 1.3 % IJ SUSP
20.0000 mL | Freq: Once | INTRAMUSCULAR | Status: DC
  Filled 2013-08-22: qty 20

## 2013-08-22 MED ORDER — DEXAMETHASONE SODIUM PHOSPHATE 10 MG/ML IJ SOLN: 10.0000 mg | Freq: Once | INTRAMUSCULAR | Status: DC

## 2013-08-22 MED ORDER — VALSARTAN-HYDROCHLOROTHIAZIDE 160-25 MG PO TABS
1.0000 | ORAL_TABLET | Freq: Every day | ORAL | Status: DC
Start: 2013-08-23 — End: 2013-08-22

## 2013-08-22 MED ORDER — FENTANYL CITRATE 0.05 MG/ML IJ SOLN
INTRAMUSCULAR | Status: AC
  Filled 2013-08-22: qty 2

## 2013-08-22 MED ORDER — HYDROMORPHONE HCL 2 MG PO TABS
2.0000 mg | ORAL_TABLET | ORAL | Status: DC | PRN
  Administered 2013-08-22: 2 mg via ORAL
  Administered 2013-08-23 – 2013-08-24 (×8): 4 mg via ORAL
  Administered 2013-08-24: 2 mg via ORAL
  Administered 2013-08-24: 4 mg via ORAL
  Administered 2013-08-24: 2 mg via ORAL
  Administered 2013-08-25 – 2013-08-26 (×6): 4 mg via ORAL
  Filled 2013-08-22 (×5): qty 2
  Filled 2013-08-22: qty 1
  Filled 2013-08-22 (×4): qty 2
  Filled 2013-08-22: qty 1
  Filled 2013-08-22 (×5): qty 2
  Filled 2013-08-22: qty 1
  Filled 2013-08-22: qty 2

## 2013-08-22 MED ORDER — DEXTROSE-NACL 5-0.9 % IV SOLN
INTRAVENOUS | Status: DC
  Administered 2013-08-22 – 2013-08-24 (×2): via INTRAVENOUS

## 2013-08-22 MED ORDER — FENTANYL CITRATE 0.05 MG/ML IJ SOLN
INTRAMUSCULAR | Status: DC | PRN
  Administered 2013-08-22: 50 ug via INTRAVENOUS

## 2013-08-22 MED ORDER — KETOROLAC TROMETHAMINE 15 MG/ML IJ SOLN: 7.5000 mg | Freq: Four times a day (QID) | INTRAMUSCULAR | Status: AC | PRN

## 2013-08-22 MED ORDER — FLEET ENEMA 7-19 GM/118ML RE ENEM: 1.0000 | ENEMA | Freq: Once | RECTAL | Status: AC | PRN

## 2013-08-22 MED ORDER — METHOCARBAMOL 500 MG PO TABS
500.0000 mg | ORAL_TABLET | Freq: Four times a day (QID) | ORAL | Status: DC | PRN
  Administered 2013-08-22 – 2013-08-24 (×5): 500 mg via ORAL
  Filled 2013-08-22 (×5): qty 1

## 2013-08-22 MED ORDER — BISACODYL 10 MG RE SUPP: 10.0000 mg | Freq: Every day | RECTAL | Status: DC | PRN

## 2013-08-22 SURGICAL SUPPLY — 60 items
BAG SPEC THK2 15X12 ZIP CLS (MISCELLANEOUS) ×1
BAG ZIPLOCK 12X15 (MISCELLANEOUS) ×3 IMPLANT
BANDAGE ELASTIC 6 VELCRO ST LF (GAUZE/BANDAGES/DRESSINGS) ×3 IMPLANT
BANDAGE ESMARK 6X9 LF (GAUZE/BANDAGES/DRESSINGS) ×1 IMPLANT
BLADE SAG 18X100X1.27 (BLADE) ×3 IMPLANT
BLADE SAW SGTL 11.0X1.19X90.0M (BLADE) ×3 IMPLANT
BNDG CMPR 9X6 STRL LF SNTH (GAUZE/BANDAGES/DRESSINGS) ×1
BNDG ESMARK 6X9 LF (GAUZE/BANDAGES/DRESSINGS) ×3
BOWL SMART MIX CTS (DISPOSABLE) ×3 IMPLANT
CAPT RP KNEE ×2 IMPLANT
CEMENT HV SMART SET (Cement) ×4 IMPLANT
CLOSURE WOUND 1/2 X4 (GAUZE/BANDAGES/DRESSINGS) ×1
CUFF TOURN SGL QUICK 34 (TOURNIQUET CUFF) ×3
CUFF TRNQT CYL 34X4X40X1 (TOURNIQUET CUFF) ×1 IMPLANT
DECANTER SPIKE VIAL GLASS SM (MISCELLANEOUS) ×3 IMPLANT
DRAPE EXTREMITY T 121X128X90 (DRAPE) ×3 IMPLANT
DRAPE POUCH INSTRU U-SHP 10X18 (DRAPES) ×3 IMPLANT
DRAPE U-SHAPE 47X51 STRL (DRAPES) ×3 IMPLANT
DRSG ADAPTIC 3X8 NADH LF (GAUZE/BANDAGES/DRESSINGS) ×3 IMPLANT
DURAPREP 26ML APPLICATOR (WOUND CARE) ×3 IMPLANT
ELECT REM PT RETURN 9FT ADLT (ELECTROSURGICAL) ×3
ELECTRODE REM PT RTRN 9FT ADLT (ELECTROSURGICAL) ×1 IMPLANT
EVACUATOR 1/8 PVC DRAIN (DRAIN) ×3 IMPLANT
FACESHIELD LNG OPTICON STERILE (SAFETY) ×15 IMPLANT
GLOVE BIO SURGEON STRL SZ7.5 (GLOVE) IMPLANT
GLOVE BIO SURGEON STRL SZ8 (GLOVE) ×3 IMPLANT
GLOVE BIOGEL PI IND STRL 7.5 (GLOVE) IMPLANT
GLOVE BIOGEL PI IND STRL 8 (GLOVE) ×2 IMPLANT
GLOVE BIOGEL PI INDICATOR 7.5 (GLOVE) ×2
GLOVE BIOGEL PI INDICATOR 8 (GLOVE) ×4
GLOVE SURG SS PI 6.5 STRL IVOR (GLOVE) ×4 IMPLANT
GLOVE SURG SS PI 7.5 STRL IVOR (GLOVE) ×2 IMPLANT
GOWN PREVENTION PLUS XLARGE (GOWN DISPOSABLE) ×2 IMPLANT
GOWN STRL REUS W/TWL LRG LVL3 (GOWN DISPOSABLE) ×5 IMPLANT
GOWN STRL REUS W/TWL XL LVL3 (GOWN DISPOSABLE) ×4 IMPLANT
HANDPIECE INTERPULSE COAX TIP (DISPOSABLE) ×3
IMMOBILIZER KNEE 20 (SOFTGOODS) ×3 IMPLANT
KIT BASIN OR (CUSTOM PROCEDURE TRAY) ×3 IMPLANT
MANIFOLD NEPTUNE II (INSTRUMENTS) ×3 IMPLANT
NDL SAFETY ECLIPSE 18X1.5 (NEEDLE) ×2 IMPLANT
NEEDLE HYPO 18GX1.5 SHARP (NEEDLE) ×6
NS IRRIG 1000ML POUR BTL (IV SOLUTION) ×3 IMPLANT
PACK TOTAL JOINT (CUSTOM PROCEDURE TRAY) ×3 IMPLANT
PAD ABD 8X10 STRL (GAUZE/BANDAGES/DRESSINGS) ×3 IMPLANT
PADDING CAST COTTON 6X4 STRL (CAST SUPPLIES) ×5 IMPLANT
POSITIONER SURGICAL ARM (MISCELLANEOUS) ×3 IMPLANT
SET HNDPC FAN SPRY TIP SCT (DISPOSABLE) ×1 IMPLANT
SPONGE GAUZE 4X4 12PLY (GAUZE/BANDAGES/DRESSINGS) ×3 IMPLANT
STRIP CLOSURE SKIN 1/2X4 (GAUZE/BANDAGES/DRESSINGS) ×3 IMPLANT
SUCTION FRAZIER 12FR DISP (SUCTIONS) ×3 IMPLANT
SUT MNCRL AB 4-0 PS2 18 (SUTURE) ×3 IMPLANT
SUT VIC AB 2-0 CT1 27 (SUTURE) ×9
SUT VIC AB 2-0 CT1 TAPERPNT 27 (SUTURE) ×3 IMPLANT
SUT VLOC 180 0 24IN GS25 (SUTURE) ×3 IMPLANT
SYR 20CC LL (SYRINGE) ×3 IMPLANT
SYR 50ML LL SCALE MARK (SYRINGE) ×3 IMPLANT
TOWEL OR 17X26 10 PK STRL BLUE (TOWEL DISPOSABLE) ×6 IMPLANT
TRAY FOLEY CATH 14FRSI W/METER (CATHETERS) ×3 IMPLANT
WATER STERILE IRR 1500ML POUR (IV SOLUTION) ×3 IMPLANT
WRAP KNEE MAXI GEL POST OP (GAUZE/BANDAGES/DRESSINGS) ×3 IMPLANT

## 2013-08-22 NOTE — Op Note (Signed)
Pre-operative diagnosis- Osteoarthritis  Right knee(s)  Post-operative diagnosis- Osteoarthritis Right knee(s)  Procedure-  Right  Total Knee Arthroplasty  Surgeon- Gus Rankin. Sydney Azure, MD  Assistant- Dimitri Ped, PA-C   Anesthesia-  Spinal EBL-* No blood loss amount entered *  Drains Hemovac  Tourniquet time- 31 minutes @ 300 mm Hg  Complications- None  Condition-PACU - hemodynamically stable.   Brief Clinical Note  Summer Thompson is a 68 y.o. year old female with end stage OA of her right knee with progressively worsening pain and dysfunction. She has constant pain, with activity and at rest and significant functional deficits with difficulties even with ADLs. She has had extensive non-op management including analgesics, injections of cortisone and viscosupplements, and home exercise program, but remains in significant pain with significant dysfunction.Radiographs show bone on bone arthritis medially. She presents now for right Total Knee Arthroplasty.    Procedure in detail---   The patient is brought into the operating room and positioned supine on the operating table. After successful administration of  Spinal,   a tourniquet is placed high on the  Right thigh(s) and the lower extremity is prepped and draped in the usual sterile fashion. Time out is performed by the operating team and then the  Right lower extremity is wrapped in Esmarch, knee flexed and the tourniquet inflated to 300 mmHg.       A midline incision is made with a ten blade through the subcutaneous tissue to the level of the extensor mechanism. A fresh blade is used to make a medial parapatellar arthrotomy. Soft tissue over the proximal medial tibia is subperiosteally elevated to the joint line with a knife and into the semimembranosus bursa with a Cobb elevator. Soft tissue over the proximal lateral tibia is elevated with attention being paid to avoiding the patellar tendon on the tibial tubercle. The patella is everted,  knee flexed 90 degrees and the ACL and PCL are removed. Findings are bone on bone medial with exposed bone patella.        The drill is used to create a starting hole in the distal femur and the canal is thoroughly irrigated with sterile saline to remove the fatty contents. The 5 degree Right  valgus alignment guide is placed into the femoral canal and the distal femoral cutting block is pinned to remove 10 mm off the distal femur. Resection is made with an oscillating saw.      The tibia is subluxed forward and the menisci are removed. The extramedullary alignment guide is placed referencing proximally at the medial aspect of the tibial tubercle and distally along the second metatarsal axis and tibial crest. The block is pinned to remove 2mm off the more deficient medial  side. Resection is made with an oscillating saw. Size 4is the most appropriate size for the tibia and the proximal tibia is prepared with the modular drill and keel punch for that size.      The femoral sizing guide is placed and size 4 is most appropriate. Rotation is marked off the epicondylar axis and confirmed by creating a rectangular flexion gap at 90 degrees. The size 4 cutting block is pinned in this rotation and the anterior, posterior and chamfer cuts are made with the oscillating saw. The intercondylar block is then placed and that cut is made.      Trial size 4 tibial component, trial size 4 posterior stabilized femur and a 10  mm posterior stabilized rotating platform insert trial is placed. Full  extension is achieved with excellent varus/valgus and anterior/posterior balance throughout full range of motion. The patella is everted and thickness measured to be 24  mm. Free hand resection is taken to 14 mm, a 38 template is placed, lug holes are drilled, trial patella is placed, and it tracks normally. Osteophytes are removed off the posterior femur with the trial in place. All trials are removed and the cut bone surfaces prepared  with pulsatile lavage. Cement is mixed and once ready for implantation, the size 4 tibial implant, size  4 posterior stabilized femoral component, and the size 38 patella are cemented in place and the patella is held with the clamp. The trial insert is placed and the knee held in full extension. The Exparel (20 ml mixed with 30 ml saline) and .25% Bupivicaine, are injected into the extensor mechanism, posterior capsule, medial and lateral gutters and subcutaneous tissues.  All extruded cement is removed and once the cement is hard the permanent 10 mm posterior stabilized rotating platform insert is placed into the tibial tray.      The wound is copiously irrigated with saline solution and the extensor mechanism closed over a hemovac drain with #1 PDS suture. The tourniquet is released for a total tourniquet time of 31  minutes. Flexion against gravity is 140 degrees and the patella tracks normally. Subcutaneous tissue is closed with 2.0 vicryl and subcuticular with running 4.0 Monocryl. The incision is cleaned and dried and steri-strips and a bulky sterile dressing are applied. The limb is placed into a knee immobilizer and the patient is awakened and transported to recovery in stable condition.      Please note that a surgical assistant was a medical necessity for this procedure in order to perform it in a safe and expeditious manner. Surgical assistant was necessary to retract the ligaments and vital neurovascular structures to prevent injury to them and also necessary for proper positioning of the limb to allow for anatomic placement of the prosthesis.   Gus RankinFrank V. Johnnye Sandford, MD    08/22/2013, 12:21 PM

## 2013-08-22 NOTE — Progress Notes (Signed)
Clinical Social Work Department BRIEF PSYCHOSOCIAL ASSESSMENT 08/22/2013  Patient:  Summer Thompson, Summer Thompson     Account Number:  [id number]     Admit date:  08/22/2013  Clinical Social Worker:  Lacie Scotts  Date/Time:  08/22/2013 04:44 PM  Referred by:  Physician  Date Referred:  08/22/2013 Referred for  SNF Placement   Other Referral:   Interview type:  Patient Other interview type:    PSYCHOSOCIAL DATA Living Status:  ALONE Admitted from facility:   Level of care:   Primary support name:  Shirlean Kelly Primary support relationship to patient:  CHILD, ADULT Degree of support available:   unclear    CURRENT CONCERNS Current Concerns  Post-Acute Placement   Other Concerns:    SOCIAL WORK ASSESSMENT / PLAN Pt is a 68 yr old female living at home prior to hospitalization. CSW met with pt to assist with d/c planning. Pt has made prior arrangements to have ST Rehab at [facility] place following hospital d/c. CSW contacted SNF and d/c plan has been confirmed. CSW will continue to follow to asist with d/c planning to SNF.   Assessment/plan status:  Psychosocial Support/Ongoing Assessment of Needs Other assessment/ plan:   Information/referral to community resources:   Insurance coverage for SNF and ambulance transport reviewed.    PATIENT'S/FAMILY'S RESPONSE TO PLAN OF CARE: Pt is relieved surgery is over. Pt denies pain. Mood is bright.She is looking forward to having rehab at [facility].   Werner Lean LCSW [phone]

## 2013-08-22 NOTE — Progress Notes (Signed)
Clinical Social Work Department CLINICAL SOCIAL WORK PLACEMENT NOTE 08/22/2013  Patient:  Victorino DecemberDAY,Pecolia B  Account Number:  [id number] Admit date:  08/22/2013  Clinical Social Worker:  Cori RazorJAMIE Davy Faught, LCSW  Date/time:  08/22/2013 04:59 PM  Clinical Social Work is seeking post-discharge placement for this patient at the following level of care:   SKILLED NURSING   (*CSW will update this form in Epic as items are completed)     Patient/family provided with [facility] Department of Clinical Social Work's list of facilities offering this level of care within the geographic area requested by the patient (or if unable, by the patient's family).  08/22/2013  Patient/family informed of their freedom to choose among providers that offer the needed level of care, that participate in Medicare, Medicaid or managed care program needed by the patient, have an available bed and are willing to accept the patient.    Patient/family informed of [facility]' ownership interest in [facility], as well as of the fact that they are under no obligation to receive care at this facility.  PASARR submitted to EDS on 08/22/2013 PASARR number received from EDS on 08/22/2013  FL2 transmitted to all facilities in geographic area requested by pt/family on  08/22/2013 FL2 transmitted to all facilities within larger geographic area on   Patient informed that his/her managed care company has contracts with or will negotiate with  certain facilities, including the following:     Patient/family informed of bed offers received:  08/22/2013 [facility] Physician recommends and patient chooses bed at    Patient to be transferred to  on   Patient to be transferred to facility by   The following physician request were entered in Epic:   Additional Comments:   Cori RazorJamie Raziel Koenigs LCSW [phone]

## 2013-08-22 NOTE — H&P (View-Only) (Signed)
Summer BoozerGail B. Thompson  DOB: 12-06-1945 Single / Language: Len[place] Ponds[place] / Race: White Female  Date of Admission:  08-22-2013  Chief Complaint:  Right Knee Pain  History of Present Illness The patient is a 68 year old female who comes in for a preoperative History and Physical. The patient is scheduled for a right total knee arthroplasty to be performed by Dr. Gus RankinFrank V. Aluisio, MD at [facility] on 08-22-2013. The patient is a 68 year old female who presents with knee complaints. The patient reports right knee symptoms including: pain and swelling which began year(s) ago without any known injury. Note for "Knee pain": She last seen Dr Rennis ChrisSupple in 2012. She had visco injections done at that time that did help some. She was told then she would need a total knee replacement. She wanted to discuss surgery when she came in. Unfortunately, her right knee continues to worsen. It is bothering her at all times. It's preventing her from doing what she desires. She can generally sleep well at night unless she's had a really busy [place]. She is not getting any locking or catching. She is not having any instability symptoms. She just has pain with just about everything she does.  She is now ready to proceed with knee replacement. They have been treated conservatively in the past for the above stated problem and despite conservative measures, they continue to have progressive pain and severe functional limitations and dysfunction. They have failed non-operative management including home exercise, medications, and injections. It is felt that they would benefit from undergoing total joint replacement. Risks and benefits of the procedure have been discussed with the patient and they elect to proceed with surgery. There are no active contraindications to surgery such as ongoing infection or rapidly progressive neurological disease.   Allergies CODEINE. 05/17/2009 Nausea. TALWIN NX. 07/09/2009 Vomiting. DILAUDID.  11/26/2010 Itching.  Problem List/Past Medical Impingement syndrome, shoulder (726.2). Right Shoulder Primary osteoarthritis of one knee (715.16). Right Knee Anxiety Disorder Hypercholesterolemia High blood pressure Menopause Measles Mumps   Family History Hypertension. mother and sister Heart disease in female family member before age 66 Severe allergy. brother Diabetes Mellitus. mother and grandmother mothers side Cancer. father, brother and grandfather mothers side Congestive Heart Failure. mother and grandmother mothers side Cerebrovascular Accident. mother Heart Disease. First Degree Relatives. sister    Social History Tobacco use. Former smoker. former smoker; smoke(d) less than 1/2 pack(s) per [place] Tobacco / smoke exposure. no Previously in rehab. no Pain Contract. no Number of flights of stairs before winded. greater than 5 Most recent primary occupation. dental office Marital status. divorced Current work status. working full time Illicit drug use. no Exercise. Exercises never Living situation. live with partner Children. 3 Alcohol use. Occasional alcohol use. current drinker; only occasionally per week Drug/Alcohol Rehab (Currently). no Advance Directives. Living Will, Healthcare POA Post-Surgical Plans. [facility]    Medication History Omeprazole (20MG  Capsule DR, Oral) Active. FLUoxetine HCl (20MG  Capsule, Oral) Active. Losartan Potassium-HCTZ ( Oral) Specific dose unknown - Active. Simvastatin ( Oral) Specific dose unknown - Active. Xanax ( Oral) Specific dose unknown - Active. Oxybutynin Chloride ( Oral) Specific dose unknown - Active. Aspirin Childrens (81MG  Tablet Chewable, Oral) Active.   Past Surgical History Tubal Ligation Spinal Surgery   Review of Systems General:Not Present- Chills, Fever, Night Sweats, Fatigue, Weight Gain, Weight Loss and Memory Loss. Skin:Not Present- Hives, Itching, Rash,  Eczema and Lesions. HEENT:Not Present- Tinnitus, Headache, Double Vision, Visual Loss, Hearing Loss and Dentures. Respiratory:Not  Present- Shortness of breath with exertion, Shortness of breath at rest, Allergies, Coughing up blood and Chronic Cough. Cardiovascular:Not Present- Chest Pain, Racing/skipping heartbeats, Difficulty Breathing Lying Down, Murmur, Swelling and Palpitations. Gastrointestinal:Not Present- Bloody Stool, Heartburn, Abdominal Pain, Vomiting, Nausea, Constipation, Diarrhea, Difficulty Swallowing, Jaundice and Loss of appetitie. Female Genitourinary:Not Present- Blood in Urine, Urinary frequency, Weak urinary stream, Discharge, Flank Pain, Incontinence, Painful Urination, Urgency, Urinary Retention and Urinating at Night. Musculoskeletal:Present- Joint Pain. Not Present- Muscle Weakness, Muscle Pain, Joint Swelling, Back Pain, Morning Stiffness and Spasms. Neurological:Not Present- Tremor, Dizziness, Blackout spells, Paralysis, Difficulty with balance and Weakness. Psychiatric:Not Present- Insomnia.    Vitals Pulse: 64 (Regular) Resp.: 14 (Unlabored) BP: 142/62 (Sitting, Right Arm, Standard)     Physical Exam The physical exam findings are as follows:   General Mental Status - Alert, cooperative and good historian. General Appearance- pleasant. Not in acute distress. Orientation- Oriented X3. Build & Nutrition- Well nourished and Well developed.   Head and Neck Head- normocephalic, atraumatic . Neck Global Assessment- supple. no bruit auscultated on the right and no bruit auscultated on the left. lower partial denture plate  Eye Pupil- Bilateral- Regular and Round. Motion- Bilateral- EOMI.   Chest and Lung Exam Auscultation: Breath sounds:- clear at anterior chest wall and - clear at posterior chest wall. Adventitious sounds:- No Adventitious sounds.   Cardiovascular Auscultation:Rhythm- Regular rate and  rhythm. Heart Sounds- S1 WNL and S2 WNL. Murmurs & Other Heart Sounds:Auscultation of the heart reveals - No Murmurs.   Abdomen Palpation/Percussion:Tenderness- Abdomen is non-tender to palpation. Rigidity (guarding)- Abdomen is soft. Auscultation:Auscultation of the abdomen reveals - Bowel sounds normal.   Female Genitourinary   Note: Not done, not pertinent to present illness  Musculoskeletal  Well developed female in no distress. Evaluation of her right knee shows no effusion. She has about a 15 degree flexion contracture and can flex to 95. She has a slight varus deformity. She has marked crepitus on range of motion with tenderness medial greater than lateral. No instability noted. Left knee shows varus deformity, no effusion. Range is about 5 to 125. She has significant varus/valgus laxity as well as AP laxity. Pulses, sensation and motor are intact in both lower extremities.  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 She is in about 15-20 degrees of varus with that knee. Her right knee shows bone on bone arthritis, tricompartmental with a varus deformity.   Assessment & Plan Primary osteoarthritis of one knee (715.16) Impression: Right Knee  Note: Plan is for a Right Total Knee Replacement by Dr. Lequita Halt.  Plan is to go to [facility].  The patient does not have any contraindications and will receive TXA (tranexamic acid) prior to surgery.  Signed electronically by Lauraine Rinne, III PA-C

## 2013-08-22 NOTE — Anesthesia Preprocedure Evaluation (Signed)
Anesthesia Evaluation  Patient identified by MRN, date of birth, ID band Patient awake    Reviewed: Allergy & Precautions, H&P , NPO status , Patient's Chart, lab work & pertinent test results  Airway Mallampati: II TM Distance: >3 FB Neck ROM: Full    Dental  (+) Dental Advisory Given   Pulmonary neg pulmonary ROS,  breath sounds clear to auscultation        Cardiovascular hypertension, Pt. on medications Rhythm:Regular Rate:Normal     Neuro/Psych PSYCHIATRIC DISORDERS Anxiety negative neurological ROS     GI/Hepatic Neg liver ROS, GERD-  Medicated,  Endo/Other  negative endocrine ROS  Renal/GU negative Renal ROS     Musculoskeletal negative musculoskeletal ROS (+)   Abdominal   Peds  Hematology negative hematology ROS (+)   Anesthesia Other Findings   Reproductive/Obstetrics negative OB ROS                           Anesthesia Physical Anesthesia Plan  ASA: II  Anesthesia Plan: Spinal   Post-op Pain Management:    Induction:   Airway Management Planned:   Additional Equipment:   Intra-op Plan:   Post-operative Plan:   Informed Consent: I have reviewed the patients History and Physical, chart, labs and discussed the procedure including the risks, benefits and alternatives for the proposed anesthesia with the patient or authorized representative who has indicated his/her understanding and acceptance.   Dental advisory given  Plan Discussed with: CRNA  Anesthesia Plan Comments:         Anesthesia Quick Evaluation

## 2013-08-22 NOTE — Anesthesia Postprocedure Evaluation (Signed)
Anesthesia Post Note  Patient: Summer Thompson  Procedure(s) Performed: Procedure(s) (LRB): RIGHT TOTAL KNEE ARTHROPLASTY (Right)  Anesthesia type: Spinal  Patient location: PACU  Post pain: Pain level controlled  Post assessment: Post-op Vital signs reviewed  Last Vitals: BP 132/83  Pulse 68  Temp(Src) 36.6 C (Oral)  Resp 17  Ht 5\' 8"  (1.727 m)  Wt 227 lb (102.967 kg)  BMI 34.52 kg/m2  SpO2 99%  Post vital signs: Reviewed  Level of consciousness: sedated  Complications: No apparent anesthesia complications

## 2013-08-22 NOTE — Progress Notes (Signed)
Plan is dc to SNF when medically stable. No NCM needs identified. Isidoro DonningAlesia Kerah Hardebeck RN CCM Case Mgmt phone [phone]

## 2013-08-22 NOTE — Anesthesia Procedure Notes (Signed)
Spinal  Patient location during procedure: OR Start time: 08/22/2013 11:22 AM End time: 08/22/2013 11:28 AM Staffing CRNA/Resident: Anne Fu Preanesthetic Checklist Completed: patient identified, site marked, surgical consent, pre-op evaluation, timeout performed, IV checked, risks and benefits discussed and monitors and equipment checked Spinal Block Patient position: sitting Prep: Betadine Patient monitoring: heart rate, continuous pulse ox and blood pressure Location: L2-3 Injection technique: single-shot Needle Needle type: Spinocan  Needle gauge: 22 G Needle length: 9 cm Assessment Sensory level: T4 Additional Notes Expiration date of kit checked and confirmed. Patient tolerated procedure well, without complications. X 1 attempt with clear CSF return, easy aspiration and administration of medication. Noted T4 level on exam.

## 2013-08-22 NOTE — Interval H&P Note (Signed)
History and Physical Interval Note:  08/22/2013 9:25 AM  Summer Thompson  has presented today for surgery, with the diagnosis of OA RIGHT KNEE  The various methods of treatment have been discussed with the patient and family. After consideration of risks, benefits and other options for treatment, the patient has consented to  Procedure(s): RIGHT TOTAL KNEE ARTHROPLASTY (Right) as a surgical intervention .  The patient's history has been reviewed, patient examined, no change in status, stable for surgery.  I have reviewed the patient's chart and labs.  Questions were answered to the patient's satisfaction.     Loanne DrillingALUISIO,Deadrick Stidd V

## 2013-08-22 NOTE — Transfer of Care (Signed)
Immediate Anesthesia Transfer of Care Note  Patient: Ancil BoozerGail B Chandley  Procedure(s) Performed: Procedure(s) (LRB): RIGHT TOTAL KNEE ARTHROPLASTY (Right)  Patient Location: PACU  Anesthesia Type: Spinal  Level of Consciousness: sedated, patient cooperative and responds to stimulation  Airway & Oxygen Therapy: Patient Spontanous Breathing and Patient connected to face mask oxgen  Post-op Assessment: Report given to PACU RN and Post -op Vital signs reviewed and stable  Post vital signs: Reviewed and stable T-12 level on exam, unable to move lower ext. Denied pain.  Complications: No apparent anesthesia complications]

## 2013-08-22 NOTE — Progress Notes (Signed)
PT Cancellation Note  Patient Details Name: Summer Thompson MRN: [id number] DOB: 11-11-1945   Cancelled Treatment:    Reason Eval/Treat Not Completed: Medical issues which prohibited therapy. Pt stated " I am not doing therapy until tomorrow". She was receptive of listening to he benefits , but was on the CPM and c/o of pain and wanting more pain meds. Did alert the nurse and she was going to f/u with pt for pain meds.    Marella BileBRITT, Summer Thompson 08/22/2013, 6:26 PM

## 2013-08-22 NOTE — Progress Notes (Signed)
Utilization review completed.  

## 2013-08-23 LAB — CBC
HCT: 32.3 % — ABNORMAL LOW (ref 36.0–46.0)
HEMOGLOBIN: 10.7 g/dL — AB (ref 12.0–15.0)
MCH: 30.3 pg (ref 26.0–34.0)
MCHC: 33.1 g/dL (ref 30.0–36.0)
MCV: 91.5 fL (ref 78.0–100.0)
Platelets: 168 10*3/uL (ref 150–400)
RBC: 3.53 MIL/uL — ABNORMAL LOW (ref 3.87–5.11)
RDW: 12.2 % (ref 11.5–15.5)
WBC: 9.6 10*3/uL (ref 4.0–10.5)

## 2013-08-23 LAB — BASIC METABOLIC PANEL
BUN: 12 mg/dL (ref 6–23)
CHLORIDE: 101 meq/L (ref 96–112)
CO2: 28 mEq/L (ref 19–32)
Calcium: 8.9 mg/dL (ref 8.4–10.5)
Creatinine, Ser: 0.64 mg/dL (ref 0.50–1.10)
Glucose, Bld: 175 mg/dL — ABNORMAL HIGH (ref 70–99)
Potassium: 4 mEq/L (ref 3.7–5.3)
SODIUM: 138 meq/L (ref 137–147)

## 2013-08-23 MED ORDER — OMEPRAZOLE 20 MG PO CPDR
40.0000 mg | DELAYED_RELEASE_CAPSULE | Freq: Every day | ORAL | Status: DC
  Administered 2013-08-23 – 2013-08-26 (×4): 40 mg via ORAL
  Filled 2013-08-23 (×4): qty 2

## 2013-08-23 MED ORDER — IRBESARTAN 150 MG PO TABS
150.0000 mg | ORAL_TABLET | Freq: Every day | ORAL | Status: DC
  Administered 2013-08-25 – 2013-08-26 (×2): 150 mg via ORAL
  Filled 2013-08-23 (×4): qty 1

## 2013-08-23 MED ORDER — SODIUM CHLORIDE 0.9 % IV BOLUS (SEPSIS)
250.0000 mL | Freq: Once | INTRAVENOUS | Status: AC
  Administered 2013-08-23: 250 mL via INTRAVENOUS

## 2013-08-23 MED ORDER — HYDROCHLOROTHIAZIDE 25 MG PO TABS
25.0000 mg | ORAL_TABLET | Freq: Every day | ORAL | Status: DC
  Administered 2013-08-25 – 2013-08-26 (×2): 25 mg via ORAL
  Filled 2013-08-23 (×4): qty 1

## 2013-08-23 MED ORDER — NON FORMULARY: 40.0000 mg | Freq: Every day | Status: DC

## 2013-08-23 NOTE — Discharge Instructions (Addendum)
° °Dr. Frank Aluisio °Total Joint Specialist °[facility] °[address] °[place], [place] [address] °[phone] ° °TOTAL KNEE REPLACEMENT POSTOPERATIVE DIRECTIONS ° ° ° °Knee Rehabilitation, Guidelines Following Surgery  °Results after knee surgery are often greatly improved when you follow the exercise, range of motion and muscle strengthening exercises prescribed by your doctor. Safety measures are also important to protect the knee from further injury. Any time any of these exercises cause you to have increased pain or swelling in your knee joint, decrease the amount until you are comfortable again and slowly increase them. If you have problems or questions, call your caregiver or physical therapist for advice.  ° °HOME CARE INSTRUCTIONS  °Remove items at home which could result in a fall. This includes throw rugs or furniture in walking pathways.  °Continue medications as instructed at time of discharge. °You may have some home medications which will be placed on hold until you complete the course of blood thinner medication.  °You may start showering once you are discharged home but do not submerge the incision under water. Just pat the incision dry and apply a dry gauze dressing on daily. °Walk with walker as instructed.  °You may resume a sexual relationship in one month or when given the OK by  your doctor.  °· Use walker as long as suggested by your caregivers. °· Avoid periods of inactivity such as sitting longer than an hour when not asleep. This helps prevent blood clots.  °You may put full weight on your legs and walk as much as is comfortable.  °You may return to work once you are cleared by your doctor.  °Do not drive a car for 6 weeks or until released by you surgeon.  °· Do not drive while taking narcotics.  °Wear the elastic stockings for three weeks following surgery during the [place] but you may remove then at night. °Make sure you keep all of your appointments after your  operation with all of your doctors and caregivers. You should call the office at the above phone number and make an appointment for approximately two weeks after the date of your surgery. °Change the dressing daily and reapply a dry dressing each time. °Please pick up a stool softener and laxative for home use as long as you are requiring pain medications. °· Continue to use ice on the knee for pain and swelling from surgery. You may notice swelling that will progress down to the foot and ankle.  This is normal after surgery.  Elevate the leg when you are not up walking on it.   °It is important for you to complete the blood thinner medication as prescribed by your doctor. °· Continue to use the breathing machine which will help keep your temperature down.  It is common for your temperature to cycle up and down following surgery, especially at night when you are not up moving around and exerting yourself.  The breathing machine keeps your lungs expanded and your temperature down. ° °RANGE OF MOTION AND STRENGTHENING EXERCISES  °Rehabilitation of the knee is important following a knee injury or an operation. After just a few days of immobilization, the muscles of the thigh which control the knee become weakened and shrink (atrophy). Knee exercises are designed to build up the tone and strength of the thigh muscles and to improve knee motion. Often times heat used for twenty to thirty minutes before working out will loosen up your tissues and help with improving the   range of motion but do not use heat for the first two weeks following surgery. These exercises can be done on a training (exercise) mat, on the floor, on a table or on a bed. Use what ever works the best and is most comfortable for you Knee exercises include:  °Leg Lifts - While your knee is still immobilized in a splint or cast, you can do straight leg raises. Lift the leg to 60 degrees, hold for 3 sec, and slowly lower the leg. Repeat 10-20 times 2-3  times daily. Perform this exercise against resistance later as your knee gets better.  °Quad and Hamstring Sets - Tighten up the muscle on the front of the thigh (Quad) and hold for 5-10 sec. Repeat this 10-20 times hourly. Hamstring sets are done by pushing the foot backward against an object and holding for 5-10 sec. Repeat as with quad sets.  °A rehabilitation program following serious knee injuries can speed recovery and prevent re-injury in the future due to weakened muscles. Contact your doctor or a physical therapist for more information on knee rehabilitation.  ° °SKILLED REHAB INSTRUCTIONS: °If the patient is transferred to a skilled rehab facility following release from the hospital, a list of the current medications will be sent to the facility for the patient to continue.  When discharged from the skilled rehab facility, please have the facility set up the patient's Home Health Physical Therapy prior to being released. Also, the skilled facility will be responsible for providing the patient with their medications at time of release from the facility to include their pain medication, the muscle relaxants, and their blood thinner medication. If the patient is still at the rehab facility at time of the two week follow up appointment, the skilled rehab facility will also need to assist the patient in arranging follow up appointment in our office and any transportation needs. ° °MAKE SURE YOU:  °Understand these instructions.  °Will watch your condition.  °Will get help right away if you are not doing well or get worse.  ° ° °Pick up stool softner and laxative for home. °Do not submerge incision under water. °May shower. °Continue to use ice for pain and swelling from surgery. ° ° °Take Xarelto for two and a half more weeks, then discontinue Xarelto. °Once the patient has completed the Xarelto, they may resume the 81 mg Aspirin. ° °When discharged from the skilled rehab facility, please have the facility set  up the patient's Home Health Physical Therapy prior to being released.  Also provide the patient with their medications at time of release from the facility to include their pain medication, the muscle relaxants, and their blood thinner medication.  If the patient is still at the rehab facility at time of follow up appointment, please also assist the patient in arranging follow up appointment in our office and any transportation needs. ° ° ° ° °Information on my medicine - XARELTO® (Rivaroxaban) ° °This medication education was reviewed with me or my healthcare representative as part of my discharge preparation.  The pharmacist that spoke with me during my hospital stay was:  Absher, Randall K, RPH ° °Why was Xarelto® prescribed for you? °Xarelto® was prescribed for you to reduce the risk of blood clots forming after orthopedic surgery. The medical term for these abnormal blood clots is venous thromboembolism (VTE). ° °What do you need to know about xarelto® ? °Take your Xarelto® ONCE DAILY at the same time every [place]. °You may take it   either with or without food. ° °If you have difficulty swallowing the tablet whole, you may crush it and mix in applesauce just prior to taking your dose. ° °Take Xarelto® exactly as prescribed by your doctor and DO NOT stop taking Xarelto® without talking to the doctor who prescribed the medication.  Stopping without other VTE prevention medication to take the place of Xarelto® may increase your risk of developing a clot. ° °After discharge, you should have regular check-up appointments with your healthcare provider that is prescribing your Xarelto®.   ° °What do you do if you miss a dose? °If you miss a dose, take it as soon as you remember on the same [place] then continue your regularly scheduled once daily regimen the next [place]. Do not take two doses of Xarelto® on the same [place].  ° °Important Safety Information °A possible side effect of Xarelto® is bleeding. You should call your  healthcare provider right away if you experience any of the following: °  Bleeding from an injury or your nose that does not stop. °  Unusual colored urine (red or dark brown) or unusual colored stools (red or black). °  Unusual bruising for unknown reasons. °  A serious fall or if you hit your head (even if there is no bleeding). ° °Some medicines may interact with Xarelto® and might increase your risk of bleeding while on Xarelto®. To help avoid this, consult your healthcare provider or pharmacist prior to using any new prescription or non-prescription medications, including herbals, vitamins, non-steroidal anti-inflammatory drugs (NSAIDs) and supplements. ° °This website has more information on Xarelto®: www.xarelto.com. ° ° °

## 2013-08-23 NOTE — Progress Notes (Signed)
OT Cancellation Note  Patient Details Name: Summer Thompson MRN: [id number] DOB: Apr 10, 1946   Cancelled Treatment:   Noted plan for SNF.  [facility] KungLorraine D 08/23/2013, 12:42 PM

## 2013-08-23 NOTE — Progress Notes (Signed)
   Subjective: 1 [place] Post-Op Procedure(s) (LRB): RIGHT TOTAL KNEE ARTHROPLASTY (Right) Patient reports pain as moderate and severe last night but better this morning. Patient seen in rounds with Dr. Lequita HaltAluisio. Patient is well, but has had some minor complaints of pain in the knee, requiring pain medications this morning. We will start therapy today.  Plan is to [facility] hospital stay.  Objective: Vital signs in last 24 hours: Temp:  [97.4 F (36.3 C)-98.6 F (37 C)] 97.5 F (36.4 C) (02/24 0654) Pulse Rate:  [45-68] 60 (02/24 0654) Resp:  [13-22] 18 (02/24 0654) BP: (100-147)/(51-83) 102/62 mmHg (02/24 0654) SpO2:  [96 %-100 %] 98 % (02/24 0654) Weight:  [102.967 kg (227 lb)] 102.967 kg (227 lb) (02/23 1521)  Intake/Output from previous [place]:  Intake/Output Summary (Last 24 hours) at 08/23/13 0823 Last data filed at 08/23/13 0700  Gross per 24 hour  Intake   4015 ml  Output   2600 ml  Net   1415 ml    Intake/Output this shift:    Labs:  Recent Labs  08/23/13 0412  HGB 10.7*    Recent Labs  08/23/13 0412  WBC 9.6  RBC 3.53*  HCT 32.3*  PLT 168    Recent Labs  08/23/13 0412  NA 138  K 4.0  CL 101  CO2 28  BUN 12  CREATININE 0.64  GLUCOSE 175*  CALCIUM 8.9   No results found for this basename: LABPT, INR,  in the last 72 hours  EXAM General - Patient is Alert and Appropriate Extremity - Neurovascular intact Sensation intact distally Dressing - dressing C/D/I Motor Function - intact, moving foot and toes well on exam.  Hemovac pulled without difficulty.  Past Medical History  Diagnosis Date  . Acid reflux   . High cholesterol   . Anxiety   . Hypertension   . Impingement syndrome of right shoulder     Assessment/Plan: 1 [place] Post-Op Procedure(s) (LRB): RIGHT TOTAL KNEE ARTHROPLASTY (Right) Principal Problem:   OA (osteoarthritis) of knee  Estimated body mass index is 34.52 kg/(m^2) as calculated from the following:  Height as of this encounter: 5\' 8"  (1.727 m).   Weight as of this encounter: 102.967 kg (227 lb). Advance diet Up with therapy Discharge to SNF  DVT Prophylaxis - Xarelto Weight-Bearing as tolerated to right leg D/C O2 and Pulse OX and try on Room [address]  Patrica DuelERKINS, Clarnce Homan 08/23/2013, 8:23 AM

## 2013-08-23 NOTE — Progress Notes (Signed)
Physical Therapy Treatment Patient Details Name: Summer DecemberGail B Thompson MRN: [id number] DOB: 1946/01/29 Today's Date: 08/23/2013 Time: [id number] PT Time Calculation (min): 24 min  PT Assessment / Plan / Recommendation  History of Present Illness     PT Comments     Follow Up Recommendations  SNF     Does the patient have the potential to tolerate intense rehabilitation     Barriers to Discharge        Equipment Recommendations  None recommended by PT    Recommendations for Other Services OT consult  Frequency 7X/week   Progress towards PT Goals Progress towards PT goals: Progressing toward goals  Plan Current plan remains appropriate    Precautions / Restrictions Precautions Precautions: Knee;Fall Required Braces or Orthoses: Knee Immobilizer - Right Knee Immobilizer - Right: Discontinue once straight leg raise with < 10 degree lag Restrictions Weight Bearing Restrictions: No Other Position/Activity Restrictions: WBAT   Pertinent Vitals/Pain 3-4/10; premed, ice pack provided    Mobility  Bed Mobility Overal bed mobility: Needs Assistance Bed Mobility: Sit to Supine Sit to supine: Min assist;Mod assist General bed mobility comments: cues for sequence and use of L LE to self assist Transfers Overall transfer level: Needs assistance Equipment used: Rolling walker (2 wheeled) Transfers: Sit to/from Stand Sit to Stand: Min assist;Mod assist General transfer comment: cues for LE managment and use of UEs to self assist Ambulation/Gait Ambulation/Gait assistance: Min assist;Mod assist Ambulation Distance (Feet): 62 Feet (twice) Assistive device: Rolling walker (2 wheeled) Gait Pattern/deviations: Step-to pattern;Decreased step length - right;Decreased step length - left;Shuffle;Antalgic;Trunk flexed Gait velocity: decr General Gait Details: cues for sequence, posture and position from RW    Exercises     PT Diagnosis:    PT Problem List:   PT Treatment Interventions:      PT Goals (current goals can now be found in the care plan section) Acute Rehab PT Goals Patient Stated Goal: Rehab and home to resume previous lifestyle with decreased pain PT Goal Formulation: With patient Time For Goal Achievement: 08/27/13 Potential to Achieve Goals: Good  Visit Information  Last PT Received On: 08/23/13 Assistance Needed: +1    Subjective Data  Patient Stated Goal: Rehab and home to resume previous lifestyle with decreased pain   Cognition  Cognition Arousal/Alertness: Awake/alert Behavior During Therapy: WFL for tasks assessed/performed Overall Cognitive Status: Within Functional Limits for tasks assessed    Balance     End of Session PT - End of Session Equipment Utilized During Treatment: Gait belt;Right knee immobilizer Activity Tolerance: Patient tolerated treatment well Patient left: in bed;with call bell/phone within reach;with family/visitor present Nurse Communication: Mobility status;Weight bearing status   GP     Shalayna Ornstein 08/23/2013, 3:32 PM

## 2013-08-23 NOTE — Evaluation (Signed)
Physical Therapy Evaluation Patient Details Name: Summer Thompson DecemberGail B Slee MRN: [id number] DOB: 31-Oct-1945 Today's Date: 08/23/2013 Time: [id number] PT Time Calculation (min): 34 min  PT Assessment / Plan / Recommendation History of Present Illness     Clinical Impression  Pt s/p R TKR presents with decreased R LE strength/ROM and post op pain limiting functional mobility.  Pt will benefit from follow up rehab at SNF level to maximize IND and safety prior to return home with ltd assist    PT Assessment  Patient needs continued PT services    Follow Up Recommendations  SNF    Does the patient have the potential to tolerate intense rehabilitation      Barriers to Discharge        Equipment Recommendations  None recommended by PT    Recommendations for Other Services OT consult   Frequency 7X/week    Precautions / Restrictions Precautions Precautions: Knee;Fall Required Braces or Orthoses: Knee Immobilizer - Right Knee Immobilizer - Right: Discontinue once straight leg raise with < 10 degree lag Restrictions Weight Bearing Restrictions: No Other Position/Activity Restrictions: WBAT   Pertinent Vitals/Pain 4-5/10; premed, cold packs provided      Mobility  Bed Mobility Overal bed mobility: Needs Assistance Bed Mobility: Supine to Sit Supine to sit: Mod assist General bed mobility comments: cues for sequence and use of L LE to self assist Transfers Overall transfer level: Needs assistance Equipment used: Rolling walker (2 wheeled) Transfers: Sit to/from Stand Sit to Stand: Mod assist General transfer comment: cues for LE managment and use of UEs to self assist Ambulation/Gait Ambulation/Gait assistance: Mod assist Ambulation Distance (Feet): 46 Feet Assistive device: Rolling walker (2 wheeled) Gait Pattern/deviations: Step-to pattern;Decreased step length - right;Decreased step length - left;Shuffle;Antalgic;Trunk flexed General Gait Details: cues for sequence, posture and  position from RW    Exercises Total Joint Exercises Ankle Circles/Pumps: AROM;Both;15 reps;Supine Quad Sets: AROM;10 reps;Supine;Both Heel Slides: AAROM;10 reps;Supine;Right Straight Leg Raises: AAROM;10 reps;Supine;Right   PT Diagnosis: Difficulty walking  PT Problem List: Decreased strength;Decreased range of motion;Decreased activity tolerance;Decreased mobility;Decreased knowledge of use of DME;Pain;Decreased knowledge of precautions PT Treatment Interventions: DME instruction;Gait training;Functional mobility training;Therapeutic activities;Patient/family education;Therapeutic exercise     PT Goals(Current goals can be found in the care plan section) Acute Rehab PT Goals Patient Stated Goal: Rehab and home to resume previous lifestyle with decreased pain PT Goal Formulation: With patient Time For Goal Achievement: 08/27/13 Potential to Achieve Goals: Good  Visit Information  Last PT Received On: 08/23/13 Assistance Needed: +1       Prior Functioning  Home Living Family/patient expects to be discharged to:: Skilled nursing facility Living Arrangements: Spouse/significant other Prior Function Level of Independence: Independent Communication Communication: No difficulties Dominant Hand: Right    Cognition  Cognition Arousal/Alertness: Awake/alert Behavior During Therapy: WFL for tasks assessed/performed Overall Cognitive Status: Within Functional Limits for tasks assessed    Extremity/Trunk Assessment Upper Extremity Assessment Upper Extremity Assessment: Overall WFL for tasks assessed Lower Extremity Assessment Lower Extremity Assessment: RLE deficits/detail RLE Deficits / Details: 2/5 quads with AAROM at knee -10 - 50   Balance    End of Session PT - End of Session Equipment Utilized During Treatment: Gait belt;Right knee immobilizer Activity Tolerance: Patient tolerated treatment well Patient left: in chair;with call bell/phone within reach;with family/visitor  present Nurse Communication: Mobility status;Weight bearing status  GP     Sahmir Weatherbee 08/23/2013, 1:00 PM

## 2013-08-24 LAB — BASIC METABOLIC PANEL
BUN: 10 mg/dL (ref 6–23)
CO2: 28 mEq/L (ref 19–32)
Calcium: 8.9 mg/dL (ref 8.4–10.5)
Chloride: 101 mEq/L (ref 96–112)
Creatinine, Ser: 0.58 mg/dL (ref 0.50–1.10)
GFR calc Af Amer: >90 mL/min (ref >90)
Glucose, Bld: 151 mg/dL — ABNORMAL HIGH (ref 70–99)
Potassium: 4 mEq/L (ref 3.7–5.3)
SODIUM: 139 meq/L (ref 137–147)

## 2013-08-24 LAB — CBC
HCT: 29.1 % — ABNORMAL LOW (ref 36.0–46.0)
Hemoglobin: 9.4 g/dL — ABNORMAL LOW (ref 12.0–15.0)
MCH: 30.3 pg (ref 26.0–34.0)
MCHC: 32.3 g/dL (ref 30.0–36.0)
MCV: 93.9 fL (ref 78.0–100.0)
Platelets: 144 10*3/uL — ABNORMAL LOW (ref 150–400)
RBC: 3.1 MIL/uL — ABNORMAL LOW (ref 3.87–5.11)
RDW: 12.6 % (ref 11.5–15.5)
WBC: 10.8 10*3/uL — AB (ref 4.0–10.5)

## 2013-08-24 MED ORDER — HYDROMORPHONE HCL 2 MG PO TABS: 2.0000 mg | ORAL_TABLET | ORAL | Status: AC | PRN

## 2013-08-24 MED ORDER — TRAMADOL HCL 50 MG PO TABS: 50.0000 mg | ORAL_TABLET | Freq: Four times a day (QID) | ORAL | Status: AC | PRN

## 2013-08-24 MED ORDER — CYCLOBENZAPRINE HCL 10 MG PO TABS: 10.0000 mg | ORAL_TABLET | Freq: Three times a day (TID) | ORAL | Status: AC | PRN

## 2013-08-24 MED ORDER — BISACODYL 10 MG RE SUPP: 10.0000 mg | Freq: Every day | RECTAL | Status: AC | PRN

## 2013-08-24 MED ORDER — DSS 100 MG PO CAPS: 100.0000 mg | ORAL_CAPSULE | Freq: Two times a day (BID) | ORAL | Status: AC

## 2013-08-24 MED ORDER — POLYETHYLENE GLYCOL 3350 17 G PO PACK: 17.0000 g | PACK | Freq: Every day | ORAL | Status: AC | PRN

## 2013-08-24 MED ORDER — METOCLOPRAMIDE HCL 5 MG PO TABS: 5.0000 mg | ORAL_TABLET | Freq: Three times a day (TID) | ORAL | Status: AC | PRN

## 2013-08-24 MED ORDER — ONDANSETRON HCL 4 MG PO TABS: 4.0000 mg | ORAL_TABLET | Freq: Four times a day (QID) | ORAL | Status: AC | PRN

## 2013-08-24 MED ORDER — RIVAROXABAN 10 MG PO TABS: 10.0000 mg | ORAL_TABLET | Freq: Every day | ORAL | Status: AC

## 2013-08-24 MED ORDER — CYCLOBENZAPRINE HCL 10 MG PO TABS: 10.0000 mg | ORAL_TABLET | Freq: Three times a day (TID) | ORAL | Status: DC | PRN

## 2013-08-24 NOTE — Discharge Summary (Addendum)
Physician Discharge Summary   Patient ID: CORRI DELAPAZ MRN: [id number] DOB/AGE: [birth year] 68 y.o.  Admit date: 08/22/2013  New Date of Discharge:  Friday - 08/26/2013  Primary Diagnosis:  Osteoarthritis Right knee(s)  Admission Diagnoses:  Past Medical History  Diagnosis Date  . Acid reflux   . High cholesterol   . Anxiety   . Hypertension   . Impingement syndrome of right shoulder    Discharge Diagnoses:   Principal Problem:   OA (osteoarthritis) of knee  Estimated body mass index is 34.52 kg/(m^2) as calculated from the following:   Height as of this encounter: 5' 8"  (1.727 m).   Weight as of this encounter: 102.967 kg (227 lb).  Procedure:  Procedure(s) (LRB): RIGHT TOTAL KNEE ARTHROPLASTY (Right)   Consults: None  HPI: Ksenia Kunz Letendre is a 68 y.o. year old female with end stage OA of her right knee with progressively worsening pain and dysfunction. She has constant pain, with activity and at rest and significant functional deficits with difficulties even with ADLs. She has had extensive non-op management including analgesics, injections of cortisone and viscosupplements, and home exercise program, but remains in significant pain with significant dysfunction.Radiographs show bone on bone arthritis medially. She presents now for right Total Knee Arthroplasty.   Laboratory Data: Admission on 08/22/2013  Component Date Value Ref Range Status  . WBC 08/23/2013 9.6  4.0 - 10.5 K/uL Final  . RBC 08/23/2013 3.53* 3.87 - 5.11 MIL/uL Final  . Hemoglobin 08/23/2013 10.7* 12.0 - 15.0 g/dL Final  . HCT 08/23/2013 32.3* 36.0 - 46.0 % Final  . MCV 08/23/2013 91.5  78.0 - 100.0 fL Final  . MCH 08/23/2013 30.3  26.0 - 34.0 pg Final  . MCHC 08/23/2013 33.1  30.0 - 36.0 g/dL Final  . RDW 08/23/2013 12.2  11.5 - 15.5 % Final  . Platelets 08/23/2013 168  150 - 400 K/uL Final  . Sodium 08/23/2013 138  137 - 147 mEq/L Final  . Potassium 08/23/2013 4.0  3.7 - 5.3 mEq/L Final  . Chloride  08/23/2013 101  96 - 112 mEq/L Final  . CO2 08/23/2013 28  19 - 32 mEq/L Final  . Glucose, Bld 08/23/2013 175* 70 - 99 mg/dL Final  . BUN 08/23/2013 12  6 - 23 mg/dL Final  . Creatinine, Ser 08/23/2013 0.64  0.50 - 1.10 mg/dL Final  . Calcium 08/23/2013 8.9  8.4 - 10.5 mg/dL Final  . GFR calc non Af Amer 08/23/2013 >90  >90 mL/min Final  . GFR calc Af Amer 08/23/2013 >90  >90 mL/min Final   Comment: (NOTE)                          The eGFR has been calculated using the CKD EPI equation.                          This calculation has not been validated in all clinical situations.                          eGFR's persistently <90 mL/min signify possible Chronic Kidney                          Disease.  . WBC 08/24/2013 10.8* 4.0 - 10.5 K/uL Final  . RBC 08/24/2013 3.10* 3.87 - 5.11 MIL/uL Final  . Hemoglobin 08/24/2013  9.4* 12.0 - 15.0 g/dL Final  . HCT 08/24/2013 29.1* 36.0 - 46.0 % Final  . MCV 08/24/2013 93.9  78.0 - 100.0 fL Final  . MCH 08/24/2013 30.3  26.0 - 34.0 pg Final  . MCHC 08/24/2013 32.3  30.0 - 36.0 g/dL Final  . RDW 08/24/2013 12.6  11.5 - 15.5 % Final  . Platelets 08/24/2013 144* 150 - 400 K/uL Final  . Sodium 08/24/2013 139  137 - 147 mEq/L Final  . Potassium 08/24/2013 4.0  3.7 - 5.3 mEq/L Final  . Chloride 08/24/2013 101  96 - 112 mEq/L Final  . CO2 08/24/2013 28  19 - 32 mEq/L Final  . Glucose, Bld 08/24/2013 151* 70 - 99 mg/dL Final  . BUN 08/24/2013 10  6 - 23 mg/dL Final  . Creatinine, Ser 08/24/2013 0.58  0.50 - 1.10 mg/dL Final  . Calcium 08/24/2013 8.9  8.4 - 10.5 mg/dL Final  . GFR calc non Af Amer 08/24/2013 >90  >90 mL/min Final  . GFR calc Af Amer 08/24/2013 >90  >90 mL/min Final   Comment: (NOTE)                          The eGFR has been calculated using the CKD EPI equation.                          This calculation has not been validated in all clinical situations.                          eGFR's persistently <90 mL/min signify possible Chronic  Kidney                          Disease.  Hospital Outpatient Visit on 08/19/2013  Component Date Value Ref Range Status  . MRSA, PCR 08/19/2013 NEGATIVE  NEGATIVE Final  . Staphylococcus aureus 08/19/2013 NEGATIVE  NEGATIVE Final   Comment:                                 The Xpert SA Assay (FDA                          approved for NASAL specimens                          in patients over 31 years of age),                          is one component of                          a comprehensive surveillance                          program.  Test performance has                          been validated by [employer] for patients greater  than or equal to 57 year old.                          It is not intended                          to diagnose infection nor to                          guide or monitor treatment.  Marland Kitchen aPTT 08/19/2013 32  24 - 37 seconds Final  . WBC 08/19/2013 5.8  4.0 - 10.5 K/uL Final  . RBC 08/19/2013 4.08  3.87 - 5.11 MIL/uL Final  . Hemoglobin 08/19/2013 12.4  12.0 - 15.0 g/dL Final  . HCT 08/19/2013 37.2  36.0 - 46.0 % Final  . MCV 08/19/2013 91.2  78.0 - 100.0 fL Final  . MCH 08/19/2013 30.4  26.0 - 34.0 pg Final  . MCHC 08/19/2013 33.3  30.0 - 36.0 g/dL Final  . RDW 08/19/2013 12.1  11.5 - 15.5 % Final  . Platelets 08/19/2013 191  150 - 400 K/uL Final  . Sodium 08/19/2013 138  137 - 147 mEq/L Final  . Potassium 08/19/2013 4.3  3.7 - 5.3 mEq/L Final  . Chloride 08/19/2013 98  96 - 112 mEq/L Final  . CO2 08/19/2013 30  19 - 32 mEq/L Final  . Glucose, Bld 08/19/2013 94  70 - 99 mg/dL Final  . BUN 08/19/2013 11  6 - 23 mg/dL Final  . Creatinine, Ser 08/19/2013 0.69  0.50 - 1.10 mg/dL Final  . Calcium 08/19/2013 9.5  8.4 - 10.5 mg/dL Final  . Total Protein 08/19/2013 7.7  6.0 - 8.3 g/dL Final  . Albumin 08/19/2013 3.9  3.5 - 5.2 g/dL Final  . AST 08/19/2013 24  0 - 37 U/L Final  . ALT 08/19/2013 25  0 - 35 U/L  Final  . Alkaline Phosphatase 08/19/2013 62  39 - 117 U/L Final  . Total Bilirubin 08/19/2013 0.5  0.3 - 1.2 mg/dL Final  . GFR calc non Af Amer 08/19/2013 89* >90 mL/min Final  . GFR calc Af Amer 08/19/2013 >90  >90 mL/min Final   Comment: (NOTE)                          The eGFR has been calculated using the CKD EPI equation.                          This calculation has not been validated in all clinical situations.                          eGFR's persistently <90 mL/min signify possible Chronic Kidney                          Disease.  Marland Kitchen Prothrombin Time 08/19/2013 13.0  11.6 - 15.2 seconds Final  . INR 08/19/2013 1.00  0.00 - 1.49 Final  . ABO/RH(D) 08/19/2013 O POS   Final  . Antibody Screen 08/19/2013 NEG   Final  . Sample Expiration 08/19/2013 08/25/2013   Final  . Color, Urine 08/19/2013 YELLOW  YELLOW Final  . APPearance 08/19/2013 CLOUDY* CLEAR Final  . Specific Gravity, Urine 08/19/2013 1.022  1.005 - 1.030 Final  .  pH 08/19/2013 6.5  5.0 - 8.0 Final  . Glucose, UA 08/19/2013 NEGATIVE  NEGATIVE mg/dL Final  . Hgb urine dipstick 08/19/2013 NEGATIVE  NEGATIVE Final  . Bilirubin Urine 08/19/2013 NEGATIVE  NEGATIVE Final  . Ketones, ur 08/19/2013 NEGATIVE  NEGATIVE mg/dL Final  . Protein, ur 08/19/2013 NEGATIVE  NEGATIVE mg/dL Final  . Urobilinogen, UA 08/19/2013 0.2  0.0 - 1.0 mg/dL Final  . Nitrite 08/19/2013 NEGATIVE  NEGATIVE Final  . Leukocytes, UA 08/19/2013 NEGATIVE  NEGATIVE Final   MICROSCOPIC NOT DONE ON URINES WITH NEGATIVE PROTEIN, BLOOD, LEUKOCYTES, NITRITE, OR GLUCOSE <1000 mg/dL.  . ABO/RH(D) 08/19/2013 O POS   Final     X-Rays:Dg Chest 2 View  08/19/2013   CLINICAL DATA:  Preop total knee replacement.  EXAM: CHEST  2 VIEW  COMPARISON:  February 02, 2007.  FINDINGS: The heart size and mediastinal contours are within normal limits. Both lungs are clear. Stable moderate dextroscoliosis of thoracic spine.  IMPRESSION: No acute cardiopulmonary abnormality seen.    Electronically Signed   By: Sabino Dick M.D.   On: 08/19/2013 14:09    EKG: Orders placed during the hospital encounter of 06/29/12  . ED EKG  . ED EKG  . EKG     Hospital Course: Latamara Melder Pop is a 68 y.o. who was admitted to [facility]. They were brought to the operating room on 08/22/2013 and underwent Procedure(s): RIGHT TOTAL KNEE ARTHROPLASTY.  Patient tolerated the procedure well and was later transferred to the recovery room and then to the orthopaedic floor for postoperative care.  They were given PO and IV analgesics for pain control following their surgery.  They were given 24 hours of postoperative antibiotics of  Anti-infectives   Start     Dose/Rate Route Frequency Ordered Stop   08/22/13 1800  ceFAZolin (ANCEF) IVPB 2 g/50 mL premix     2 g 100 mL/hr over 30 Minutes Intravenous Every 6 hours 08/22/13 1436 08/23/13 0121   08/22/13 0840  ceFAZolin (ANCEF) IVPB 2 g/50 mL premix     2 g 100 mL/hr over 30 Minutes Intravenous On call to O.R. 08/22/13 0840 08/22/13 1135     and started on DVT prophylaxis in the form of Xarelto.   PT and OT were ordered for total joint protocol.  Discharge planning consulted to help with postop disposition and equipment needs.  Social worker was consulted to assist with placement of the patient after surgery.  Patient had a rough night on the evening of surgery due to pain.  They started to get up OOB with therapy on [place] one. Hemovac drain was pulled without difficulty.  Continued to work with therapy into [place] two.  Dressing was changed on [place] two and the incision was healing well. She also had some itching and felt that it was the muscle relaxant and so it was switched to Flexeril. Patient was seen in rounds on [place] two and was doing well.  It was felt that as long as the patient continued to improve, they would be ready to transfer the following [place], Thursday  08-25-2013.  The patient would evaluated the following [place] and transferred if doing  well.  This summary was prepared in anticipation of the the patient's transfer on Thursday due to the concerns with reports of incoming inclement weather.  The facility's pharmacy may or may not be opened and accessible on Thursday and wanted to get a tentative summary and the RX's for the patient in  anticipation of transfer. Tentative summary completed today along with RX's.  ADDENDUM -  The patient was kept on [place] and had some O2 sats down into the 80's.  She was positive on her fluids and given lasix.  She had great urine output and was seen today, Friday, 08/26/2013, and doing better with improved O2 sats.  Seen by Dr. Wynelle Link and transfer today.  Discharge Medications: Prior to Admission medications   Medication Sig Start Date End Date Taking? Authorizing Provider  acetaminophen (TYLENOL) 500 MG tablet Take 500 mg by mouth every 6 (six) hours as needed for mild pain or moderate pain.   Yes Historical Provider, MD  ALPRAZolam (XANAX) 0.25 MG tablet Take 0.125-0.25 mg by mouth daily as needed for anxiety. Half tab for break through panic attacks or high anxiety   Yes Historical Provider, MD  FLUoxetine (PROZAC) 20 MG capsule Take 20 mg by mouth daily with lunch.    Yes Historical Provider, MD  omeprazole (PRILOSEC) 40 MG capsule Take 40 mg by mouth daily.   Yes Historical Provider, MD  oxybutynin (DITROPAN) 5 MG tablet Take 5 mg by mouth 2 (two) times daily.   Yes Historical Provider, MD  simvastatin (ZOCOR) 80 MG tablet Take 80 mg by mouth daily with lunch.   Yes Historical Provider, MD  valsartan-hydrochlorothiazide (DIOVAN-HCT) 160-25 MG per tablet Take 1 tablet by mouth daily with lunch.   Yes Historical Provider, MD  bisacodyl (DULCOLAX) 10 MG suppository Place 1 suppository (10 mg total) rectally daily as needed for moderate constipation. 08/24/13   Reid Regas Dara Lords, PA-C  cyclobenzaprine (FLEXERIL) 10 MG tablet Take 1 tablet (10 mg total) by mouth 3 (three) times daily as needed for  muscle spasms. 08/24/13   Cornelious Diven, PA-C  docusate sodium 100 MG CAPS Take 100 mg by mouth 2 (two) times daily. 08/24/13   Patty Lopezgarcia, PA-C  HYDROmorphone (DILAUDID) 2 MG tablet Take 1-2 tablets (2-4 mg total) by mouth every 3 (three) hours as needed for moderate pain or severe pain. 08/24/13   Sakai Heinle Dara Lords, PA-C  metoCLOPramide (REGLAN) 5 MG tablet Take 1-2 tablets (5-10 mg total) by mouth every 8 (eight) hours as needed for nausea (if ondansetron (ZOFRAN) ineffective.). 08/24/13   Elyssia Strausser, PA-C  ondansetron (ZOFRAN) 4 MG tablet Take 1 tablet (4 mg total) by mouth every 6 (six) hours as needed for nausea. 08/24/13   Flynn Gwyn, PA-C  polyethylene glycol (MIRALAX / GLYCOLAX) packet Take 17 g by mouth daily as needed for mild constipation. 08/24/13   Sie Formisano Dara Lords, PA-C  rivaroxaban (XARELTO) 10 MG TABS tablet Take 1 tablet (10 mg total) by mouth daily with breakfast. Take Xarelto for two and a half more weeks, then discontinue Xarelto. Once the patient has completed the Xarelto, they may resume the 81 mg Aspirin. 08/24/13   Younique Casad Dara Lords, PA-C  traMADol (ULTRAM) 50 MG tablet Take 1-2 tablets (50-100 mg total) by mouth every 6 (six) hours as needed for moderate pain. 08/24/13   Kiandra Sanguinetti Dara Lords, PA-C    Diet: Cardiac diet Activity:WBAT Follow-up:in 2 weeks with Dr. Wynelle Link Disposition - Skilled nursing facility - [facility] Discharged Condition: Pending at time of summary, Transfer on Thursday following evaluation by Dr Wynelle Link.  **Please place the patient on [place] O2 at night.**   Discharge Orders   Future Orders Complete By Expires   Call MD / Call 911  As directed    Comments:     If you experience chest pain or shortness of  breath, CALL 911 and be transported to the hospital emergency room.  If you develope a fever above 101 F, pus (white drainage) or increased drainage or redness at the wound, or calf pain, call your surgeon's office.     Change dressing  As directed    Comments:     Change dressing daily with sterile 4 x 4 inch gauze dressing and apply TED hose. Do not submerge the incision under water.   Constipation Prevention  As directed    Comments:     Drink plenty of fluids.  Prune juice may be helpful.  You may use a stool softener, such as Colace (over the counter) 100 mg twice a [place].  Use MiraLax (over the counter) for constipation as needed.   Diet - low sodium heart healthy  As directed    Discharge instructions  As directed    Comments:     Pick up stool softner and laxative for home. Do not submerge incision under water. May shower. Continue to use ice for pain and swelling from surgery.   Take Xarelto for two and a half more weeks, then discontinue Xarelto. Once the patient has completed the Xarelto, they may resume the 81 mg Aspirin.  When discharged from the skilled rehab facility, please have the facility set up the patient's [place] prior to being released.  Also provide the patient with their medications at time of release from the facility to include their pain medication, the muscle relaxants, and their blood thinner medication.  If the patient is still at the rehab facility at time of follow up appointment, please also assist the patient in arranging follow up appointment in our office and any transportation needs.   Do not put a pillow under the knee. Place it under the heel.  As directed    Do not sit on low chairs, stoools or toilet seats, as it may be difficult to get up from low surfaces  As directed    Driving restrictions  As directed    Comments:     No driving until released by the physician.   Increase activity slowly as tolerated  As directed    Lifting restrictions  As directed    Comments:     No lifting until released by the physician.   Patient may shower  As directed    Comments:     You may shower without a dressing once there is no drainage.  Do not wash  over the wound.  If drainage remains, do not shower until drainage stops.   TED hose  As directed    Comments:     Use stockings (TED hose) for 3 weeks on both leg(s).  You may remove them at night for sleeping.   Weight bearing as tolerated  As directed    Questions:     Laterality:     Extremity:         Medication List    STOP taking these medications       aspirin EC 81 MG tablet     ibuprofen 200 MG tablet  Commonly known as:  ADVIL,MOTRIN      TAKE these medications       acetaminophen 500 MG tablet  Commonly known as:  TYLENOL  Take 500 mg by mouth every 6 (six) hours as needed for mild pain or moderate pain.     ALPRAZolam 0.25 MG tablet  Commonly known as:  XANAX  Take 0.125-0.25  mg by mouth daily as needed for anxiety. Half tab for break through panic attacks or high anxiety     bisacodyl 10 MG suppository  Commonly known as:  DULCOLAX  Place 1 suppository (10 mg total) rectally daily as needed for moderate constipation.     cyclobenzaprine 10 MG tablet  Commonly known as:  FLEXERIL  Take 1 tablet (10 mg total) by mouth 3 (three) times daily as needed for muscle spasms.     DSS 100 MG Caps  Take 100 mg by mouth 2 (two) times daily.     FLUoxetine 20 MG capsule  Commonly known as:  PROZAC  Take 20 mg by mouth daily with lunch.     HYDROmorphone 2 MG tablet  Commonly known as:  DILAUDID  Take 1-2 tablets (2-4 mg total) by mouth every 3 (three) hours as needed for moderate pain or severe pain.     metoCLOPramide 5 MG tablet  Commonly known as:  REGLAN  Take 1-2 tablets (5-10 mg total) by mouth every 8 (eight) hours as needed for nausea (if ondansetron (ZOFRAN) ineffective.).     omeprazole 40 MG capsule  Commonly known as:  PRILOSEC  Take 40 mg by mouth daily.     ondansetron 4 MG tablet  Commonly known as:  ZOFRAN  Take 1 tablet (4 mg total) by mouth every 6 (six) hours as needed for nausea.     oxybutynin 5 MG tablet  Commonly known as:   DITROPAN  Take 5 mg by mouth 2 (two) times daily.     polyethylene glycol packet  Commonly known as:  MIRALAX / GLYCOLAX  Take 17 g by mouth daily as needed for mild constipation.     rivaroxaban 10 MG Tabs tablet  Commonly known as:  XARELTO  - Take 1 tablet (10 mg total) by mouth daily with breakfast. Take Xarelto for two and a half more weeks, then discontinue Xarelto.  - Once the patient has completed the Xarelto, they may resume the 81 mg Aspirin.     simvastatin 80 MG tablet  Commonly known as:  ZOCOR  Take 80 mg by mouth daily with lunch.     traMADol 50 MG tablet  Commonly known as:  ULTRAM  Take 1-2 tablets (50-100 mg total) by mouth every 6 (six) hours as needed for moderate pain.     valsartan-hydrochlorothiazide 160-25 MG per tablet  Commonly known as:  DIOVAN-HCT  Take 1 tablet by mouth daily with lunch.           Follow-up Information   Follow up with Gearlean Alf, MD On 09/06/2013. (Please call office to make appointment time for patient.)    Specialty:  Orthopedic Surgery   Contact information:   [address] [place] [address] [phone]       Signed: Mickel Crow 08/24/2013, 1:33 PM

## 2013-08-24 NOTE — Progress Notes (Signed)
Physical Therapy Treatment Patient Details Name: Summer Thompson Setzler MRN: [id number] DOB: [birth year] Today's Date: 08/24/2013 Time: [id number] PT Time Calculation (min): 26 min  PT Assessment / Plan / Recommendation  History of Present Illness     PT Comments     Follow Up Recommendations  SNF     Does the patient have the potential to tolerate intense rehabilitation     Barriers to Discharge        Equipment Recommendations  None recommended by PT    Recommendations for Other Services OT consult  Frequency 7X/week   Progress towards PT Goals Progress towards PT goals: Progressing toward goals  Plan Current plan remains appropriate    Precautions / Restrictions Precautions Precautions: Knee;Fall Required Braces or Orthoses: Knee Immobilizer - Right Knee Immobilizer - Right: Discontinue once straight leg raise with < 10 degree lag Restrictions Weight Bearing Restrictions: No Other Position/Activity Restrictions: WBAT   Pertinent Vitals/Pain 4/10; premed, ice packs provided    Mobility  Bed Mobility Overal bed mobility: Needs Assistance Bed Mobility: Sit to Supine Sit to supine: Min assist;Mod assist General bed mobility comments: cues for sequence and use of L LE to self assist Transfers Overall transfer level: Needs assistance Equipment used: Rolling walker (2 wheeled) Transfers: Sit to/from Stand Sit to Stand: Min assist;Mod assist General transfer comment: cues for LE managment and use of UEs to self assist Ambulation/Gait Ambulation/Gait assistance: Min assist Ambulation Distance (Feet): 82 Feet (twice) Assistive device: Rolling walker (2 wheeled) Gait Pattern/deviations: Step-to pattern;Shuffle;Antalgic;Trunk flexed;Decreased step length - right;Decreased step length - left General Gait Details: cues for sequence, posture and position from RW    Exercises     PT Diagnosis:    PT Problem List:   PT Treatment Interventions:     PT Goals (current goals can now be  found in the care plan section) Acute Rehab PT Goals Patient Stated Goal: Rehab and home to resume previous lifestyle with decreased pain PT Goal Formulation: With patient Time For Goal Achievement: 08/27/13 Potential to Achieve Goals: Good  Visit Information  Last PT Received On: 08/24/13 Assistance Needed: +1    Subjective Data  Patient Stated Goal: Rehab and home to resume previous lifestyle with decreased pain   Cognition  Cognition Arousal/Alertness: Awake/alert Behavior During Therapy: WFL for tasks assessed/performed Overall Cognitive Status: Within Functional Limits for tasks assessed    Balance     End of Session PT - End of Session Equipment Utilized During Treatment: Gait belt;Right knee immobilizer Activity Tolerance: Patient tolerated treatment well Patient left: in bed;with call bell/phone within reach Nurse Communication: Mobility status;Weight bearing status   GP     Herchel Hopkin 08/24/2013, 3:49 PM

## 2013-08-24 NOTE — Progress Notes (Signed)
   Subjective: 2 Days Post-Op Procedure(s) (LRB): RIGHT TOTAL KNEE ARTHROPLASTY (Right) Patient reports pain as mild and moderate.   Patient seen in rounds with Dr. Lequita HaltAluisio. Her daughter was in the room at her bedside. Patient is well, but has had some minor complaints of pain in the knee, requiring pain medications.  She also had some itching and felt that is was the muscle relaxant and so it was switched to Flexeril. Plan is to go Skilled nursing facility after hospital stay.  Objective: Vital signs in last 24 hours: Temp:  [97.8 F (36.6 C)-98 F (36.7 C)] 97.8 F (36.6 C) (02/25 0530) Pulse Rate:  [45-61] 45 (02/25 0932) Resp:  [16-18] 16 (02/25 0936) BP: (100-126)/(61-76) 120/66 mmHg (02/25 0932) SpO2:  [93 %-100 %] 100 % (02/25 0530)  Intake/Output from previous [place]:  Intake/Output Summary (Last 24 hours) at 08/24/13 1323 Last data filed at 08/24/13 1251  Gross per 24 hour  Intake 2843.75 ml  Output   2550 ml  Net 293.75 ml    Intake/Output this shift: Total I/O In: 683.8 [P.O.:240; I.V.:443.8] Out: 850 [Urine:850]  Labs:  Recent Labs  08/23/13 0412 08/24/13 0430  HGB 10.7* 9.4*    Recent Labs  08/23/13 0412 08/24/13 0430  WBC 9.6 10.8*  RBC 3.53* 3.10*  HCT 32.3* 29.1*  PLT 168 144*    Recent Labs  08/23/13 0412 08/24/13 0430  NA 138 139  K 4.0 4.0  CL 101 101  CO2 28 28  BUN 12 10  CREATININE 0.64 0.58  GLUCOSE 175* 151*  CALCIUM 8.9 8.9   No results found for this basename: LABPT, INR,  in the last 72 hours  EXAM General - Patient is Alert, Appropriate and Oriented Extremity - Neurovascular intact Sensation intact distally Dressing/Incision - clean, dry, no drainage Motor Function - intact, moving foot and toes well on exam.   Past Medical History  Diagnosis Date  . Acid reflux   . High cholesterol   . Anxiety   . Hypertension   . Impingement syndrome of right shoulder     Assessment/Plan: 2 Days Post-Op Procedure(s)  (LRB): RIGHT TOTAL KNEE ARTHROPLASTY (Right) Principal Problem:   OA (osteoarthritis) of knee  Estimated body mass index is 34.52 kg/(m^2) as calculated from the following:   Height as of this encounter: 5\' 8"  (1.727 m).   Weight as of this encounter: 102.967 kg (227 lb). Up with therapy Plan for discharge tomorrow Discharge to SNF  The skilled facility was concerned with the reports of incoming inclement weather that their pharmacy may or may not be opened and accessible tomorrow and would like to get a tentative summary and the RX's for the patient in anticipation of transfer over tomorrow.  Tentative summary completed today along with RX's/  DVT Prophylaxis - Xarelto Weight-Bearing as tolerated to right leg  Yania Bogie 08/24/2013, 1:23 PM

## 2013-08-24 NOTE — Progress Notes (Signed)
Physical Therapy Treatment Patient Details Name: Summer Thompson MRN: [id number] DOB: 08/07/1945 Today's Date: 08/24/2013 Time: [id number] PT Time Calculation (min): 38 min  PT Assessment / Plan / Recommendation  History of Present Illness     PT Comments     Follow Up Recommendations  SNF     Does the patient have the potential to tolerate intense rehabilitation     Barriers to Discharge        Equipment Recommendations  None recommended by PT    Recommendations for Other Services OT consult  Frequency 7X/week   Progress towards PT Goals Progress towards PT goals: Progressing toward goals  Plan Current plan remains appropriate    Precautions / Restrictions Precautions Precautions: Knee;Fall Required Braces or Orthoses: Knee Immobilizer - Right Knee Immobilizer - Right: Discontinue once straight leg raise with < 10 degree lag Restrictions Weight Bearing Restrictions: No Other Position/Activity Restrictions: WBAT   Pertinent Vitals/Pain 5/10. Premed, ice packs provided    Mobility  Bed Mobility Overal bed mobility: Needs Assistance Bed Mobility: Supine to Sit Supine to sit: Min assist;Mod assist General bed mobility comments: cues for sequence and use of L LE to self assist Transfers Overall transfer level: Needs assistance Equipment used: Rolling walker (2 wheeled) Transfers: Sit to/from Stand Sit to Stand: Mod assist General transfer comment: cues for LE managment and use of UEs to self assist Ambulation/Gait Ambulation/Gait assistance: Min assist;Mod assist Ambulation Distance (Feet): 50 Feet (twice) Assistive device: Rolling walker (2 wheeled) Gait Pattern/deviations: Step-to pattern;Decreased step length - right;Decreased step length - left;Shuffle;Antalgic;Trunk flexed Gait velocity: decr General Gait Details: cues for sequence, posture and position from RW    Exercises Total Joint Exercises Ankle Circles/Pumps: AROM;Both;15 reps;Supine Quad Sets:  AROM;Supine;Both;20 reps Heel Slides: AAROM;Supine;Right;20 reps Straight Leg Raises: AAROM;Supine;Right;20 reps   PT Diagnosis:    PT Problem List:   PT Treatment Interventions:     PT Goals (current goals can now be found in the care plan section) Acute Rehab PT Goals Patient Stated Goal: Rehab and home to resume previous lifestyle with decreased pain PT Goal Formulation: With patient Time For Goal Achievement: 08/27/13 Potential to Achieve Goals: Good  Visit Information  Last PT Received On: 08/24/13 Assistance Needed: +1    Subjective Data  Patient Stated Goal: Rehab and home to resume previous lifestyle with decreased pain   Cognition  Cognition Arousal/Alertness: Awake/alert Behavior During Therapy: WFL for tasks assessed/performed Overall Cognitive Status: Within Functional Limits for tasks assessed    Balance     End of Session PT - End of Session Equipment Utilized During Treatment: Gait belt;Right knee immobilizer Activity Tolerance: Patient tolerated treatment well Patient left: in chair;with call bell/phone within reach;with family/visitor present Nurse Communication: Mobility status;Weight bearing status CPM Right Knee CPM Right Knee: Off   GP     Summer Thompson 08/24/2013, 11:59 AM

## 2013-08-25 ENCOUNTER — Inpatient Hospital Stay (HOSPITAL_COMMUNITY): Payer: Medicare Other

## 2013-08-25 LAB — CBC
HCT: 28.7 % — ABNORMAL LOW (ref 36.0–46.0)
Hemoglobin: 9 g/dL — ABNORMAL LOW (ref 12.0–15.0)
MCH: 29.3 pg (ref 26.0–34.0)
MCHC: 31.4 g/dL (ref 30.0–36.0)
MCV: 93.5 fL (ref 78.0–100.0)
Platelets: 147 10*3/uL — ABNORMAL LOW (ref 150–400)
RBC: 3.07 MIL/uL — ABNORMAL LOW (ref 3.87–5.11)
RDW: 12.9 % (ref 11.5–15.5)
WBC: 12.1 10*3/uL — AB (ref 4.0–10.5)

## 2013-08-25 MED ORDER — FLEET ENEMA 7-19 GM/118ML RE ENEM
1.0000 | ENEMA | Freq: Once | RECTAL | Status: AC
  Administered 2013-08-25: 1 via RECTAL
  Filled 2013-08-25: qty 1

## 2013-08-25 MED ORDER — FUROSEMIDE 10 MG/ML IJ SOLN
20.0000 mg | Freq: Once | INTRAMUSCULAR | Status: AC
  Administered 2013-08-25: 20 mg via INTRAVENOUS
  Filled 2013-08-25: qty 2

## 2013-08-25 NOTE — Progress Notes (Signed)
Physical Therapy Treatment Patient Details Name: Summer Thompson [place]lock MRN: [id number] DOB: June 26, 1946 Today's Date: 08/25/2013 Time: 4098-11911150-1231 PT Time Calculation (min): 41 min  PT Assessment / Plan / Recommendation  History of Present Illness     PT Comments     Follow Up Recommendations  SNF     Does the patient have the potential to tolerate intense rehabilitation     Barriers to Discharge        Equipment Recommendations  None recommended by PT    Recommendations for Other Services OT consult  Frequency 7X/week   Progress towards PT Goals Progress towards PT goals: Progressing toward goals  Plan Current plan remains appropriate    Precautions / Restrictions Precautions Precautions: Knee;Fall Required Braces or Orthoses: Knee Immobilizer - Right Knee Immobilizer - Right: Discontinue once straight leg raise with < 10 degree lag Restrictions Weight Bearing Restrictions: No Other Position/Activity Restrictions: WBAT   Pertinent Vitals/Pain 6/10; premed, cold packs provided, O2 sats 95% with activity    Mobility  Bed Mobility Overal bed mobility: Needs Assistance Bed Mobility: Sit to Supine Sit to supine: Min assist General bed mobility comments: cues for sequence and use of L LE to self assist Transfers Overall transfer level: Needs assistance Equipment used: Rolling walker (2 wheeled) Transfers: Sit to/from Stand Sit to Stand: Min assist;Mod assist General transfer comment: cues for LE managment and use of UEs to self assist Ambulation/Gait Ambulation/Gait assistance: Min assist Ambulation Distance (Feet): 62 Feet (72 and 20) Assistive device: Rolling walker (2 wheeled) Gait Pattern/deviations: Step-to pattern;Decreased step length - right;Decreased step length - left;Shuffle;Antalgic;Trunk flexed Gait velocity: decr General Gait Details: cues for sequence, posture and position from RW    Exercises Total Joint Exercises Ankle Circles/Pumps: AROM;Both;15  reps;Supine Quad Sets: AROM;Supine;Both;20 reps Heel Slides: AAROM;Supine;Right;20 reps Straight Leg Raises: AAROM;Supine;Right;20 reps   PT Diagnosis:    PT Problem List:   PT Treatment Interventions:     PT Goals (current goals can now be found in the care plan section) Acute Rehab PT Goals Patient Stated Goal: Rehab and home to resume previous lifestyle with decreased pain PT Goal Formulation: With patient Time For Goal Achievement: 08/27/13 Potential to Achieve Goals: Good  Visit Information  Last PT Received On: 08/25/13 Assistance Needed: +1    Subjective Data  Subjective: It hurt a lot this morning but its doing better now Patient Stated Goal: Rehab and home to resume previous lifestyle with decreased pain   Cognition  Cognition Arousal/Alertness: Awake/alert Behavior During Therapy: WFL for tasks assessed/performed Overall Cognitive Status: Within Functional Limits for tasks assessed    Balance     End of Session PT - End of Session Equipment Utilized During Treatment: Gait belt;Right knee immobilizer Activity Tolerance: Patient tolerated treatment well Patient left: in bed;with call bell/phone within reach Nurse Communication: Mobility status;Weight bearing status   GP     Lenaya Pietsch 08/25/2013, 1:44 PM

## 2013-08-25 NOTE — Progress Notes (Signed)
   Subjective: 3 Days Post-Op Procedure(s) (LRB): RIGHT TOTAL KNEE ARTHROPLASTY (Right) Patient reports pain as moderate. Patient has multiple complaints this AM. Has knee pain and hurts in multiple places. Also has a headache intermittently. Had low oxygen saturation (85%) while sleeping last night (apparently this has been present prior to surgery); knee feels warm to her daughter  Plan is to go Skilled nursing facility after hospital stay but is not medically able to go today  Objective: Vital signs in last 24 hours: Temp:  [98 F (36.7 C)-99.5 F (37.5 C)] 99.5 F (37.5 C) (02/26 0526) Pulse Rate:  [62-79] 79 (02/26 0526) Resp:  [16] 16 (02/26 0526) BP: (122-135)/(67-74) 127/70 mmHg (02/26 0526) SpO2:  [90 %-96 %] 90 % (02/26 0526)  Intake/Output from previous [place]:  Intake/Output Summary (Last 24 hours) at 08/25/13 1112 Last data filed at 08/24/13 1251  Gross per 24 hour  Intake 443.75 ml  Output    850 ml  Net -406.25 ml    Intake/Output this shift:    Labs:  Recent Labs  08/23/13 0412 08/24/13 0430 08/25/13 0352  HGB 10.7* 9.4* 9.0*    Recent Labs  08/24/13 0430 08/25/13 0352  WBC 10.8* 12.1*  RBC 3.10* 3.07*  HCT 29.1* 28.7*  PLT 144* 147*    Recent Labs  08/23/13 0412 08/24/13 0430  NA 138 139  K 4.0 4.0  CL 101 101  CO2 28 28  BUN 12 10  CREATININE 0.64 0.58  GLUCOSE 175* 151*  CALCIUM 8.9 8.9   No results found for this basename: LABPT, INR,  in the last 72 hours  EXAM General - Patient is Alert, Appropriate and Oriented Extremity - Neurologically intact Neurovascular intact Incision: dressing C/D/I No cellulitis present Compartment soft Temperature of knee is normal for 3 days post-op. Dressing/Incision - clean, dry, no drainage Motor Function - intact, moving foot and toes well on exam.  Patient is breathing comfortably with no wheezing and no obvious shortness of breath  Past Medical History  Diagnosis Date  . Acid reflux     . High cholesterol   . Anxiety   . Hypertension   . Impingement syndrome of right shoulder     Assessment/Plan: 3 Days Post-Op Procedure(s) (LRB): RIGHT TOTAL KNEE ARTHROPLASTY (Right) Principal Problem:   OA (osteoarthritis) of knee   Up with therapy Discharge to SNF tomorrow if medically ready Chest x-ray to r/o edema or atelectasis. Not short of breath or tachycardic thus does not appear to have had a PE  DVT Prophylaxis - Xarelto Weight-Bearing as tolerated to right leg  Davie Sagona V 08/25/2013, 11:12 AM

## 2013-08-25 NOTE — Progress Notes (Signed)
[facility] if ready for d/c.  Cori RazorJamie Gregoria Selvy LCSW [phone]

## 2013-08-26 NOTE — Progress Notes (Signed)
Clinical Social Work Department CLINICAL SOCIAL WORK PLACEMENT NOTE 08/26/2013  Patient:  Summer Thompson,[name]  Account Number:  [id number] Admit date:  08/22/2013  Clinical Social Worker:  Cori RazorJAMIE Connee Ikner, LCSW  Date/time:  08/22/2013 04:59 PM  Clinical Social Work is seeking post-discharge placement for this patient at the following level of care:   SKILLED NURSING   (*CSW will update this form in Epic as items are completed)     Patient/family provided with [facility] Department of Clinical Social Work's list of facilities offering this level of care within the geographic area requested by the patient (or if unable, by the patient's family).  08/22/2013  Patient/family informed of their freedom to choose among providers that offer the needed level of care, that participate in Medicare, Medicaid or managed care program needed by the patient, have an available bed and are willing to accept the patient.    Patient/family informed of [facility]' ownership interest in [facility], as well as of the fact that they are under no obligation to receive care at this facility.  PASARR submitted to EDS on 08/22/2013 PASARR number received from [employer] on 08/22/2013  FL2 transmitted to all facilities in geographic area requested by pt/family on  08/22/2013 FL2 transmitted to all facilities within larger geographic area on   Patient informed that his/her managed care company has contracts with or will negotiate with  certain facilities, including the following:     Patient/family informed of bed offers received:  08/22/2013 [facility] Physician recommends and patient chooses bed at    [facility]  08/26/2013 Patient to be transferred to facility by [facility]  The following physician request were entered in Epic:   Additional Comments:  Cori RazorJamie Semisi Biela LCSW [phone]

## 2013-08-26 NOTE — Progress Notes (Signed)
Physical Therapy Treatment Patient Details Name: Summer Thompson MRN: [id number] DOB: 03-10-1946 Today's Date: 08/26/2013 Time: [id number] PT Time Calculation (min): 33 min  PT Assessment / Plan / Recommendation  History of Present Illness     PT Comments     Follow Up Recommendations  SNF     Does the patient have the potential to tolerate intense rehabilitation     Barriers to Discharge        Equipment Recommendations  None recommended by PT    Recommendations for Other Services OT consult  Frequency 7X/week   Progress towards PT Goals Progress towards PT goals: Progressing toward goals  Plan Current plan remains appropriate    Precautions / Restrictions Precautions Precautions: Knee;Fall Required Braces or Orthoses: Knee Immobilizer - Right Knee Immobilizer - Right: Discontinue once straight leg raise with < 10 degree lag Restrictions Weight Bearing Restrictions: No Other Position/Activity Restrictions: WBAT   Pertinent Vitals/Pain 5/10; premed, ice packs provided    Mobility  Bed Mobility Overal bed mobility: Needs Assistance Bed Mobility: Supine to Sit Supine to sit: Min assist;Mod assist General bed mobility comments: cues for sequence and use of L LE to self assist Transfers Overall transfer level: Needs assistance Equipment used: Rolling walker (2 wheeled) Transfers: Sit to/from Stand Sit to Stand: Min assist;Mod assist General transfer comment: cues for LE managment and use of UEs to self assist Ambulation/Gait Ambulation/Gait assistance: Min assist Ambulation Distance (Feet): 123 Feet Assistive device: Rolling walker (2 wheeled) Gait Pattern/deviations: Step-to pattern;Decreased step length - right;Decreased step length - left;Shuffle;Trunk flexed General Gait Details: cues for sequence, posture and position from RW    Exercises Total Joint Exercises Ankle Circles/Pumps: AROM;Both;15 reps;Supine Quad Sets: AROM;Supine;Both;20 reps Heel Slides:  AAROM;Supine;Right;20 reps Straight Leg Raises: AAROM;Supine;Right;20 reps   PT Diagnosis:    PT Problem List:   PT Treatment Interventions:     PT Goals (current goals can now be found in the care plan section) Acute Rehab PT Goals Patient Stated Goal: Rehab and home to resume previous lifestyle with decreased pain PT Goal Formulation: With patient Time For Goal Achievement: 08/27/13 Potential to Achieve Goals: Good  Visit Information  Last PT Received On: 08/26/13 Assistance Needed: +1    Subjective Data  Subjective: Doing much better than yesterday Patient Stated Goal: Rehab and home to resume previous lifestyle with decreased pain   Cognition  Cognition Arousal/Alertness: Awake/alert Behavior During Therapy: WFL for tasks assessed/performed Overall Cognitive Status: Within Functional Limits for tasks assessed    Balance     End of Session PT - End of Session Equipment Utilized During Treatment: Gait belt;Right knee immobilizer Activity Tolerance: Patient tolerated treatment well Patient left: in chair;with call bell/phone within reach;with family/visitor present Nurse Communication: Mobility status;Weight bearing status   GP     Summer Thompson 08/26/2013, 12:45 PM

## 2013-08-26 NOTE — Plan of Care (Signed)
Problem: Discharge Progression Outcomes Goal: Negotiates stairs Outcome: Not Met (add Reason) Pt. To do in rehab

## 2013-08-26 NOTE — Progress Notes (Signed)
   Subjective: 4 Days Post-Op Procedure(s) (LRB): RIGHT TOTAL KNEE ARTHROPLASTY (Right) Patient reports pain as mild.   Patient seen in rounds with Dr. Lequita HaltAluisio. Family in room. Patient is well, but has had some minor complaints of pain in the knee, requiring pain medications [facility]  Objective: Vital signs in last 24 hours: Temp:  [97.9 F (36.6 C)-98.9 F (37.2 C)] 98.9 F (37.2 C) (02/27 1339) Pulse Rate:  [62-78] 78 (02/27 1339) Resp:  [16-20] 16 (02/27 1339) BP: (106-133)/(68-78) 133/75 mmHg (02/27 1339) SpO2:  [91 %-97 %] 94 % (02/27 1339)  Intake/Output from previous [place]:  Intake/Output Summary (Last 24 hours) at 08/26/13 1502 Last data filed at 08/26/13 1340  Gross per 24 hour  Intake   1180 ml  Output   6750 ml  Net  -5570 ml    Intake/Output this shift: Total I/O In: 600 [P.O.:600] Out: 800 [Urine:800]  Labs:  Recent Labs  08/24/13 0430 08/25/13 0352  HGB 9.4* 9.0*    Recent Labs  08/24/13 0430 08/25/13 0352  WBC 10.8* 12.1*  RBC 3.10* 3.07*  HCT 29.1* 28.7*  PLT 144* 147*    Recent Labs  08/24/13 0430  NA 139  K 4.0  CL 101  CO2 28  BUN 10  CREATININE 0.58  GLUCOSE 151*  CALCIUM 8.9   No results found for this basename: LABPT, INR,  in the last 72 hours  EXAM: General - Patient is Alert, Appropriate and Oriented Extremity - Neurovascular intact Sensation intact distally Incision - clean, dry, no drainage, healing Motor Function - intact, moving foot and toes well on exam.   Assessment/Plan: 4 Days Post-Op Procedure(s) (LRB): RIGHT TOTAL KNEE ARTHROPLASTY (Right) Procedure(s) (LRB): RIGHT TOTAL KNEE ARTHROPLASTY (Right) Past Medical History  Diagnosis Date  . Acid reflux   . High cholesterol   . Anxiety   . Hypertension   . Impingement syndrome of right shoulder    Principal Problem:   OA (osteoarthritis) of knee  Estimated body mass index is 34.52 kg/(m^2) as calculated from the  following:   Height as of this encounter: 5\' 8"  (1.727 m).   Weight as of this encounter: 102.967 kg (227 lb). Up with therapy Discharge to SNF Diet - Cardiac diet Follow up - in 2 weeks from surgery Activity - WBAT Disposition - Skilled nursing facility - [facility] Condition Upon Discharge - Stable D/C Meds - See DC Summary DVT Prophylaxis - Xarelto  Song Garris 08/26/2013, 3:02 PM

## 2013-08-29 ENCOUNTER — Non-Acute Institutional Stay (SKILLED_NURSING_FACILITY): Payer: Medicare Other | Admitting: Adult Health

## 2013-08-29 DIAGNOSIS — F3289 Other specified depressive episodes: Secondary | ICD-10-CM

## 2013-08-29 DIAGNOSIS — K219 Gastro-esophageal reflux disease without esophagitis: Secondary | ICD-10-CM

## 2013-08-29 DIAGNOSIS — F329 Major depressive disorder, single episode, unspecified: Secondary | ICD-10-CM

## 2013-08-29 DIAGNOSIS — M179 Osteoarthritis of knee, unspecified: Secondary | ICD-10-CM

## 2013-08-29 DIAGNOSIS — M1710 Unilateral primary osteoarthritis, unspecified knee: Secondary | ICD-10-CM

## 2013-08-29 DIAGNOSIS — B370 Candidal stomatitis: Secondary | ICD-10-CM

## 2013-08-29 DIAGNOSIS — F32A Depression, unspecified: Secondary | ICD-10-CM

## 2013-08-29 DIAGNOSIS — E7800 Pure hypercholesterolemia, unspecified: Secondary | ICD-10-CM

## 2013-08-29 DIAGNOSIS — IMO0002 Reserved for concepts with insufficient information to code with codable children: Secondary | ICD-10-CM

## 2013-08-29 DIAGNOSIS — I10 Essential (primary) hypertension: Secondary | ICD-10-CM

## 2013-08-29 DIAGNOSIS — K5900 Constipation, unspecified: Secondary | ICD-10-CM

## 2013-09-03 ENCOUNTER — Non-Acute Institutional Stay (SKILLED_NURSING_FACILITY): Payer: Medicare Other | Admitting: Internal Medicine

## 2013-09-03 ENCOUNTER — Encounter: Payer: Self-pay | Admitting: Internal Medicine

## 2013-09-03 DIAGNOSIS — K219 Gastro-esophageal reflux disease without esophagitis: Secondary | ICD-10-CM

## 2013-09-03 DIAGNOSIS — D62 Acute posthemorrhagic anemia: Secondary | ICD-10-CM | POA: Insufficient documentation

## 2013-09-03 DIAGNOSIS — I10 Essential (primary) hypertension: Secondary | ICD-10-CM | POA: Insufficient documentation

## 2013-09-03 DIAGNOSIS — E7800 Pure hypercholesterolemia, unspecified: Secondary | ICD-10-CM | POA: Insufficient documentation

## 2013-09-03 DIAGNOSIS — Z96659 Presence of unspecified artificial knee joint: Secondary | ICD-10-CM

## 2013-09-03 DIAGNOSIS — F411 Generalized anxiety disorder: Secondary | ICD-10-CM

## 2013-09-03 DIAGNOSIS — F419 Anxiety disorder, unspecified: Secondary | ICD-10-CM | POA: Insufficient documentation

## 2013-09-03 NOTE — Assessment & Plan Note (Signed)
Continue omeprazole 40 mg  ?

## 2013-09-03 NOTE — Assessment & Plan Note (Signed)
Controlled with diovan 160/HCT 25 mg daily

## 2013-09-03 NOTE — Assessment & Plan Note (Signed)
Hb 12.4 pre-op,  9.0 post-op;will follow.

## 2013-09-03 NOTE — Assessment & Plan Note (Signed)
For end stAGE OA; pain meds, flexeril and xarelto as prophylaxis for 2 1/2 weeks from d/c of 2/27; after this time pt is to resume ASA 81 mg daily

## 2013-09-03 NOTE — Assessment & Plan Note (Signed)
Continue zocor 80mg daily  °

## 2013-09-03 NOTE — Assessment & Plan Note (Signed)
Continue prozac and prn xanax

## 2013-09-03 NOTE — Progress Notes (Signed)
MRN: [id number] Name: Summer Thompson  Sex: female Age: 68 y.o. DOB: 07/18/1945  [facility] #: [facility] Facility/Room: 702 Level Of Care: SNF Provider: Merrilee Seashore D Emergency Contacts: Extended Emergency Contact Information Primary Emergency Contact: Hedge,Deborah Address: [address]          [place] [address] [place] Phone: [phone] Relation: Daughter Secondary Emergency Contact: Pearson,Leslie Address: [address]          [place], [place] [address] [place] of [place] Home Phone: [phone] Mobile Phone: [phone] Relation: Daughter   Allergies: Morphine and related; Oxycodone; and Robaxin  Chief Complaint  Patient presents with  . nursing home admission    HPI: Patient is 68 y.o. female who is s/p R knee arthroplasty admitted to SNF for OT/PT.  Past Medical History  Diagnosis Date  . Impingement syndrome of right shoulder   . Acid reflux   . High cholesterol   . Hypertension   . Anxiety     Past Surgical History  Procedure Laterality Date  . Tubal ligation    . Back surgery  2-3 yrs ago    lower  . Total knee arthroplasty Right 08/22/2013    Procedure: RIGHT TOTAL KNEE ARTHROPLASTY;  Surgeon: Loanne Drilling, MD;  Location: [facility];  Service: Orthopedics;  Laterality: Right;      Medication List       This list is accurate as of: 09/03/13 10:24 PM.  Always use your most recent med list.               acetaminophen 500 MG tablet  Commonly known as:  TYLENOL  Take 500 mg by mouth every 6 (six) hours as needed for mild pain or moderate pain.     ALPRAZolam 0.25 MG tablet  Commonly known as:  XANAX  Take 0.25 mg by mouth 2 (two) times daily as needed for anxiety. Half tab for break through panic attacks or high anxiety     bisacodyl 10 MG suppository  Commonly known as:  DULCOLAX  Place 1 suppository (10 mg total) rectally daily as needed for moderate constipation.     cyclobenzaprine 10 MG tablet   Commonly known as:  FLEXERIL  Take 1 tablet (10 mg total) by mouth 3 (three) times daily as needed for muscle spasms.     DSS 100 MG Caps  Take 100 mg by mouth 2 (two) times daily.     FLUoxetine 20 MG capsule  Commonly known as:  PROZAC  Take 20 mg by mouth daily with lunch.     HYDROmorphone 2 MG tablet  Commonly known as:  DILAUDID  Take 1-2 tablets (2-4 mg total) by mouth every 3 (three) hours as needed for moderate pain or severe pain.     metoCLOPramide 5 MG tablet  Commonly known as:  REGLAN  Take 1-2 tablets (5-10 mg total) by mouth every 8 (eight) hours as needed for nausea (if ondansetron (ZOFRAN) ineffective.).     omeprazole 40 MG capsule  Commonly known as:  PRILOSEC  Take 40 mg by mouth daily.     ondansetron 4 MG tablet  Commonly known as:  ZOFRAN  Take 1 tablet (4 mg total) by mouth every 6 (six) hours as needed for nausea.     oxybutynin 5 MG tablet  Commonly known as:  DITROPAN  Take 5 mg by mouth 2 (two) times daily.     polyethylene glycol packet  Commonly known as:  MIRALAX / GLYCOLAX  Take 17 g by mouth daily as needed for mild constipation.     rivaroxaban 10 MG Tabs tablet  Commonly known as:  XARELTO  - Take 1 tablet (10 mg total) by mouth daily with breakfast. Take Xarelto for two and a half more weeks, then discontinue Xarelto.  - Once the patient has completed the Xarelto, they may resume the 81 mg Aspirin.     simvastatin 80 MG tablet  Commonly known as:  ZOCOR  Take 80 mg by mouth daily with lunch.     traMADol 50 MG tablet  Commonly known as:  ULTRAM  Take 1-2 tablets (50-100 mg total) by mouth every 6 (six) hours as needed for moderate pain.     valsartan-hydrochlorothiazide 160-25 MG per tablet  Commonly known as:  DIOVAN-HCT  Take 1 tablet by mouth daily with lunch.        No orders of the defined types were placed in this encounter.     There is no immunization history on file for this patient.  History  Substance Use  Topics  . Smoking status: Never Smoker   . Smokeless tobacco: Never Used  . Alcohol Use: No    Family history is noncontributory    Review of Systems  DATA OBTAINED: from patient, family member GENERAL: Feels well no fevers, fatigue, appetite changes SKIN: No itching, rash or wounds EYES: No eye pain, redness, discharge EARS: No earache, tinnitus, change in hearing NOSE: No congestion, drainage or bleeding  MOUTH/THROAT: No mouth or tooth pain, No sore throat, No difficulty chewing or swallowing  RESPIRATORY: No cough, wheezing, SOB CARDIAC: No chest pain, palpitations, lower extremity edema  GI: No abdominal pain, No N/V/D or constipation, No heartburn or reflux  GU: No dysuria, frequency or urgency,  +incontinence new since surgery-pt admits did have epidural MUSCULOSKELETAL: No unrelieved bone/joint pain although admits this pain is worse than her back surgery NEUROLOGIC: No headache, dizziness or focal weakness PSYCHIATRIC: No overt anxiety or sadness. Sleeps well. No behavior issue.   Filed Vitals:   09/03/13 2202  BP: 117/58  Pulse: 63  Temp: 99.5 F (37.5 C)  Resp: 20    Physical Exam  GENERAL APPEARANCE: Alert, conversant. Appropriately groomed. No acute distress, upbeat,pleasant  SKIN: No diaphoresis rash, or wounds HEAD: Normocephalic, atraumatic  EYES: Conjunctiva/lids clear. Pupils round, reactive. EOMs intact.  EARS: External exam WNL, canals clear. Hearing grossly normal.  NOSE: No deformity or discharge.  MOUTH/THROAT: Lips w/o lesions RESPIRATORY: Breathing is even, unlabored. Lung sounds are clear   CARDIOVASCULAR: Heart RRR no murmurs, rubs or gallops. Trace R peripheral edema.  GASTROINTESTINAL: Abdomen is soft, non-tender, not distended w/ normal bowel sounds GENITOURINARY: Bladder non tender, not distended  MUSCULOSKELETAL: polar care over R knee NEUROLOGIC: Oriented X3. Cranial nerves 2-12 grossly intact. Moves all extremities no  tremor. PSYCHIATRIC: Mood and affect appropriate to situation, no behavioral issues  Patient Active Problem List   Diagnosis Date Noted  . S/P total knee arthroplasty 09/03/2013  . Acute blood loss anemia 09/03/2013  . Acid reflux   . High cholesterol   . Hypertension   . Anxiety   . OA (osteoarthritis) of knee 08/22/2013    CBC    Component Value Date/Time   WBC 12.1* 08/25/2013 0352   RBC 3.07* 08/25/2013 0352   HGB 9.0* 08/25/2013 0352   HCT 28.7* 08/25/2013 0352   PLT 147* 08/25/2013 0352   MCV 93.5 08/25/2013 0352   LYMPHSABS  2.2 06/29/2012 1829   MONOABS 0.6 06/29/2012 1829   EOSABS 0.1 06/29/2012 1829   BASOSABS 0.0 06/29/2012 1829    CMP     Component Value Date/Time   NA 139 08/24/2013 0430   K 4.0 08/24/2013 0430   CL 101 08/24/2013 0430   CO2 28 08/24/2013 0430   GLUCOSE 151* 08/24/2013 0430   BUN 10 08/24/2013 0430   CREATININE 0.58 08/24/2013 0430   CALCIUM 8.9 08/24/2013 0430   PROT 7.7 08/19/2013 1350   ALBUMIN 3.9 08/19/2013 1350   AST 24 08/19/2013 1350   ALT 25 08/19/2013 1350   ALKPHOS 62 08/19/2013 1350   BILITOT 0.5 08/19/2013 1350   GFRNONAA >90 08/24/2013 0430   GFRAA >90 08/24/2013 0430    Assessment and Plan  S/P total knee arthroplasty For end stAGE OA; pain meds, flexeril and xarelto as prophylaxis for 2 1/2 weeks from d/c of 2/27; after this time pt is to resume ASA 81 mg daily  Hypertension Controlled with diovan 160/HCT 25 mg daily  Acid reflux Continue omeprazole 40 mg  High cholesterol Continue zocor 80 mg daily  Anxiety Continue prozac and prn xanax  Acute blood loss anemia Hb 12.4 pre-op,  9.0 post-op;will follow.     Margit Hanks, MD

## 2013-09-07 ENCOUNTER — Non-Acute Institutional Stay (SKILLED_NURSING_FACILITY): Payer: Medicare Other | Admitting: Adult Health

## 2013-09-07 DIAGNOSIS — I10 Essential (primary) hypertension: Secondary | ICD-10-CM

## 2013-09-07 DIAGNOSIS — M1710 Unilateral primary osteoarthritis, unspecified knee: Secondary | ICD-10-CM

## 2013-09-07 DIAGNOSIS — F411 Generalized anxiety disorder: Secondary | ICD-10-CM

## 2013-09-07 DIAGNOSIS — K219 Gastro-esophageal reflux disease without esophagitis: Secondary | ICD-10-CM

## 2013-09-07 DIAGNOSIS — Z96659 Presence of unspecified artificial knee joint: Secondary | ICD-10-CM

## 2013-09-07 DIAGNOSIS — E7800 Pure hypercholesterolemia, unspecified: Secondary | ICD-10-CM

## 2013-09-07 DIAGNOSIS — F32A Depression, unspecified: Secondary | ICD-10-CM

## 2013-09-07 DIAGNOSIS — F3289 Other specified depressive episodes: Secondary | ICD-10-CM

## 2013-09-07 DIAGNOSIS — K5900 Constipation, unspecified: Secondary | ICD-10-CM

## 2013-09-07 DIAGNOSIS — IMO0002 Reserved for concepts with insufficient information to code with codable children: Secondary | ICD-10-CM

## 2013-09-07 DIAGNOSIS — F419 Anxiety disorder, unspecified: Secondary | ICD-10-CM

## 2013-09-07 DIAGNOSIS — M179 Osteoarthritis of knee, unspecified: Secondary | ICD-10-CM

## 2013-09-07 DIAGNOSIS — F329 Major depressive disorder, single episode, unspecified: Secondary | ICD-10-CM

## 2013-09-08 ENCOUNTER — Encounter: Payer: Self-pay | Admitting: Adult Health

## 2013-09-08 DIAGNOSIS — B370 Candidal stomatitis: Secondary | ICD-10-CM | POA: Insufficient documentation

## 2013-09-08 DIAGNOSIS — F32A Depression, unspecified: Secondary | ICD-10-CM | POA: Insufficient documentation

## 2013-09-08 DIAGNOSIS — K5900 Constipation, unspecified: Secondary | ICD-10-CM | POA: Insufficient documentation

## 2013-09-08 DIAGNOSIS — F329 Major depressive disorder, single episode, unspecified: Secondary | ICD-10-CM | POA: Insufficient documentation

## 2013-09-08 NOTE — Progress Notes (Signed)
Patient ID: Summer Thompson, female   DOB: Nov 12, 1945, 68 y.o.   MRN: [id number]         PROGRESS NOTE  DATE: 09/07/13  FACILITY: Nursing Home Location: [facility]  LEVEL OF CARE: SNF (31)  Acute Visit  CHIEF COMPLAINT:  Discharge Notes  HISTORY OF PRESENT ILLNESS: This is a 68 year old female who is for discharge home with Home health PT. [facility] with Osteoarthritis S/P right total knee arthroplasty. Patient was admitted to this facility for short-term rehabilitation after the patient's recent hospitalization.  Patient has completed SNF rehabilitation and therapy has cleared the patient for discharge.  REASSESSMENT OF ONGOING PROBLEM(S):  HTN: Pt 's HTN remains stable.  Denies CP, sob, DOE, pedal edema, headaches, dizziness or visual disturbances.  No complications from the medications currently being used.  Last BP : 117/58  GERD: pt's GERD is stable.  Denies ongoing heartburn, abd. Pain, nausea or vomiting.  Currently on a PPI & tolerates it without any adverse reactions.  DEPRESSION: The depression remains stable. Patient denies ongoing feelings of sadness, insomnia, anedhonia or lack of appetite. No complications reported from the medications currently being used. Staff do not report behavioral problems.  PAST MEDICAL HISTORY : Reviewed.  No changes.  CURRENT MEDICATIONS: [facility]  REVIEW OF SYSTEMS:  GENERAL: no change in appetite, no fatigue, no weight changes, no fever, chills or weakness MOUTH: complains of mouth pain when eating RESPIRATORY: no cough, SOB, DOE, wheezing, hemoptysis CARDIAC: no chest pain, edema or palpitations GI: no abdominal pain, diarrhea, constipation, heart burn, nausea or vomiting  PHYSICAL EXAMINATION  GENERAL: no acute distress, normal body habitus EYES: conjunctivae normal, sclerae normal, normal eye lids NECK: supple, trachea midline, no neck  masses, no thyroid tenderness, no thyromegaly LYMPHATICS: no LAN in the neck, no supraclavicular LAN RESPIRATORY: breathing is even & unlabored, BS CTAB CARDIAC: RRR, no murmur,no extra heart sounds, no edema GI: abdomen soft, normal BS, no masses, no tenderness, no hepatomegaly, no splenomegaly PSYCHIATRIC: the patient is alert & oriented to person, affect & behavior appropriate  LABS/RADIOLOGY: Labs reviewed: Basic Metabolic Panel:  Recent Labs  40/98/1102/20/15 1350 08/23/13 0412 08/24/13 0430  NA 138 138 139  K 4.3 4.0 4.0  CL 98 101 101  CO2 30 28 28   GLUCOSE 94 175* 151*  BUN 11 12 10   CREATININE 0.69 0.64 0.58  CALCIUM 9.5 8.9 8.9   Liver Function Tests:  Recent Labs  08/19/13 1350  AST 24  ALT 25  ALKPHOS 62  BILITOT 0.5  PROT 7.7  ALBUMIN 3.9    CBC:  Recent Labs  08/23/13 0412 08/24/13 0430 08/25/13 0352  WBC 9.6 10.8* 12.1*  HGB 10.7* 9.4* 9.0*  HCT 32.3* 29.1* 28.7*  MCV 91.5 93.9 93.5  PLT 168 144* 147*     ASSESSMENT/PLAN:  Osteoarthritis  status post right total knee arthroplasty - for Home health PT GERD - stable; continue Prilosec Constipation - no complaints; continue Miralax and Colace Depression; stable; continue Prozac Hyperlipidemia - continue Zocor Hypertension - well-controlled; continue Diovan-HCT   I have I filled out patient's discharge paperwork and written prescriptions.  Patient will receive home health PT.  Total discharge time: Less than 30 minutes Discharge time involved coordination of the discharge process with Child psychotherapistsocial worker, nursing staff and therapy department. Medical justification for home health services verified.    CPT CODE: [id number]  Seth Bake - NP [employer] [phone]

## 2013-09-08 NOTE — Progress Notes (Signed)
Patient ID: Summer Thompson, female   DOB: [birth year], 68 y.o.   MRN: [id number]               PROGRESS NOTE  DATE: 08/29/2013  FACILITY: Nursing Home Location: [facility]  LEVEL OF CARE: SNF (31)  Acute Visit  CHIEF COMPLAINT:  Follow-up hospitalization  HISTORY OF PRESENT ILLNESS: This is a 68 year old female who has been [facility]26[facility] with Osteoarthritis S/P right total knee arthroplasty. She has been admitted for a short-term rehabilitation.  REASSESSMENT OF ONGOING PROBLEM(S):  HTN: Pt 's HTN remains stable.  Denies CP, sob, DOE, pedal edema, headaches, dizziness or visual disturbances.  No complications from the medications currently being used.  Last BP : 136/70  GERD: pt's GERD is stable.  Denies ongoing heartburn, abd. Pain, nausea or vomiting.  Currently on a PPI & tolerates it without any adverse reactions.  DEPRESSION: The depression remains stable. Patient denies ongoing feelings of sadness, insomnia, anedhonia or lack of appetite. No complications reported from the medications currently being used. Staff do not report behavioral problems.  PAST MEDICAL HISTORY : Reviewed.  No changes.  CURRENT MEDICATIONS: [facility]ed per [facility]  REVIEW OF SYSTEMS:  GENERAL: no change in appetite, no fatigue, no weight changes, no fever, chills or weakness MOUTH: complains of mouth pain when eating RESPIRATORY: no cough, SOB, DOE, wheezing, hemoptysis CARDIAC: no chest pain, edema or palpitations GI: no abdominal pain, diarrhea, constipation, heart burn, nausea or vomiting  PHYSICAL EXAMINATION  GENERAL: no acute distress, normal body habitus EYES: conjunctivae normal, sclerae normal, normal eye lids MOUTH: +thick whitish cheesy plaques on the tongue NECK: supple, trachea midline, no neck masses, no thyroid tenderness, no thyromegaly LYMPHATICS: no LAN in the neck, no supraclavicular LAN RESPIRATORY: breathing is even &  unlabored, BS CTAB CARDIAC: RRR, no murmur,no extra heart sounds, no edema GI: abdomen soft, normal BS, no masses, no tenderness, no hepatomegaly, no splenomegaly PSYCHIATRIC: the patient is alert & oriented to person, affect & behavior appropriate  LABS/RADIOLOGY: Labs reviewed: Basic Metabolic Panel:  Recent Labs  40/98/1102/20/15 1350 08/23/13 0412 08/24/13 0430  NA 138 138 139  K 4.3 4.0 4.0  CL 98 101 101  CO2 30 28 28   GLUCOSE 94 175* 151*  BUN 11 12 10   CREATININE 0.69 0.64 0.58  CALCIUM 9.5 8.9 8.9   Liver Function Tests:  Recent Labs  08/19/13 1350  AST 24  ALT 25  ALKPHOS 62  BILITOT 0.5  PROT 7.7  ALBUMIN 3.9    CBC:  Recent Labs  08/23/13 0412 08/24/13 0430 08/25/13 0352  WBC 9.6 10.8* 12.1*  HGB 10.7* 9.4* 9.0*  HCT 32.3* 29.1* 28.7*  MCV 91.5 93.9 93.5  PLT 168 144* 147*     ASSESSMENT/PLAN:  Osteoarthritis 2 status post right total knee arthroplasty - for rehabilitation GERD - stable; continue Prilosec Constipation - no complaints; continue Miralax and Colace Depression; stable; continue Prozac Hyperlipidemia - continue Zocor Hypertension - well-controlled; continue Diovan-HCT Oral Candida (new) - start Magic Mouthwash 10 ml swish and spit QID x 14 days    CPT CODE: [id number]  Ella BodoMonina Vargas - NP [facility] [phone]

## 2014-11-19 IMAGING — US US ABDOMEN COMPLETE
1 series · 13 of 25 positions shown · non-contrast
Comparison: CT of the abdomen and pelvis performed 05/25/2009, and
abdominal ultrasound performed 12/17/2007

CLINICAL DATA: Abdominal pain.

ABDOMINAL ULTRASOUND COMPLETE

[Series 1: us abdomen complete · 0.30mm/px · 13 of 39 slices shown]
[im 1/39]
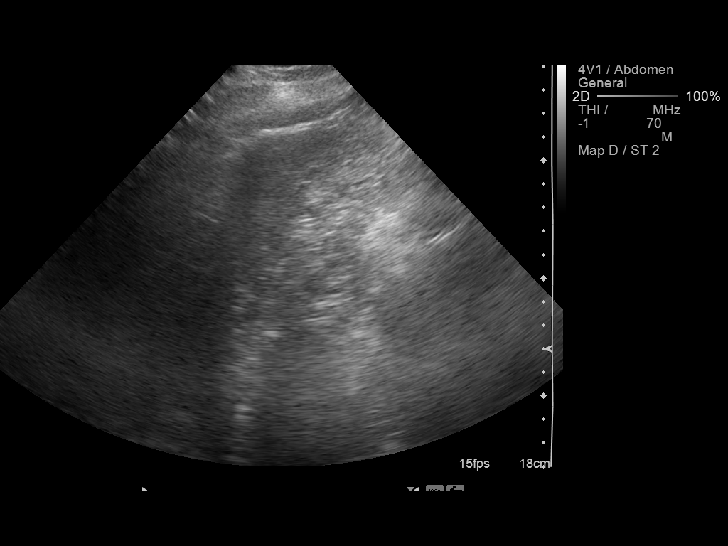
[im 4/39]
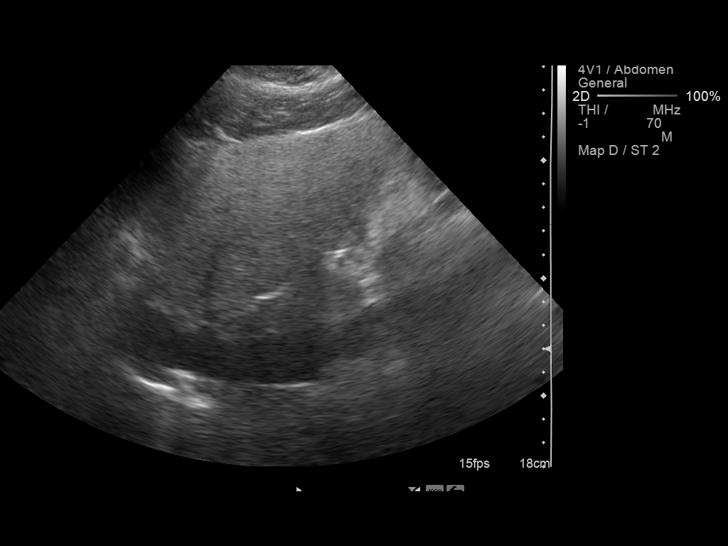
[im 7/39]
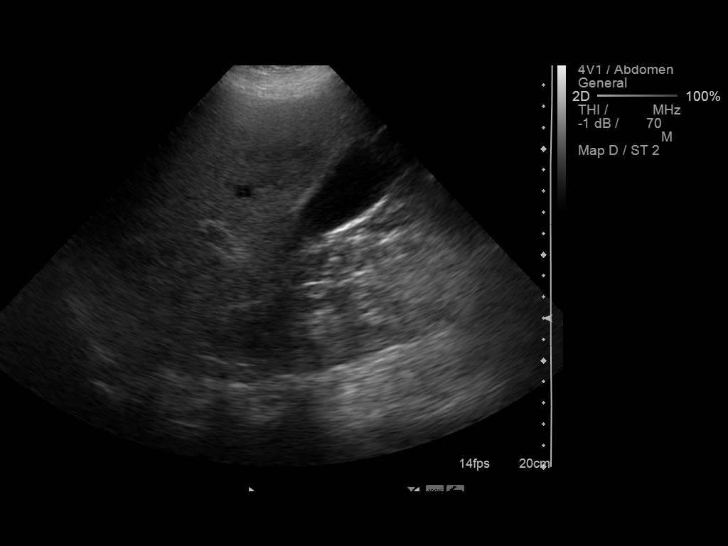
[im 10/39]
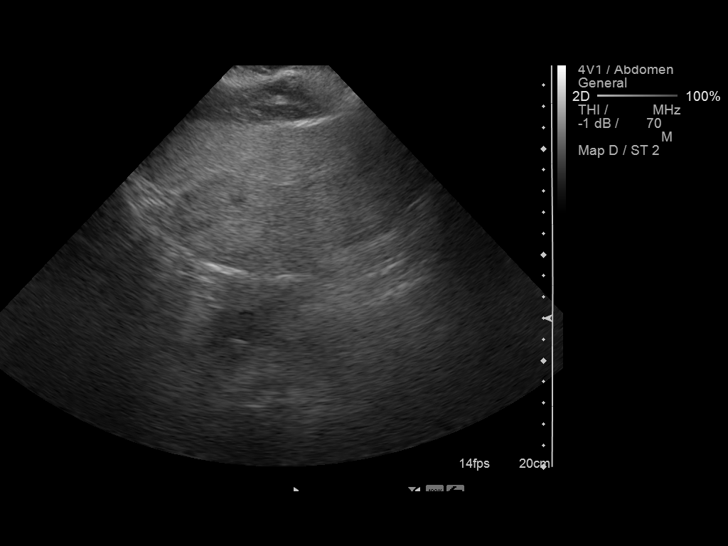
[im 13/39]
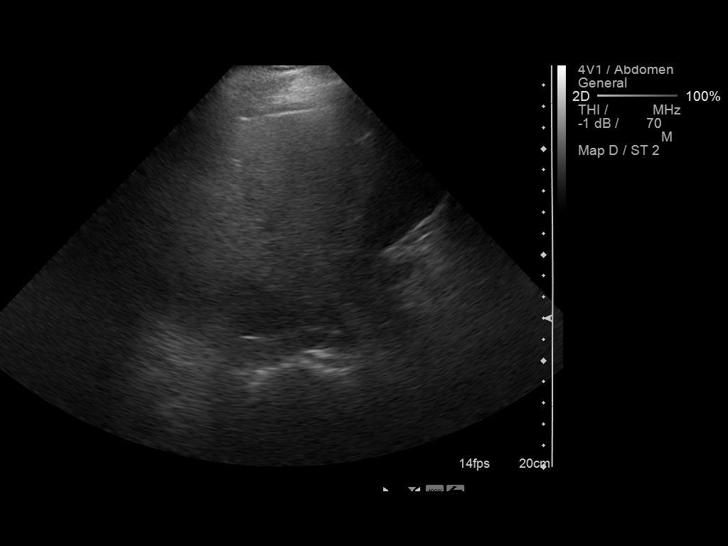
[im 16/39]
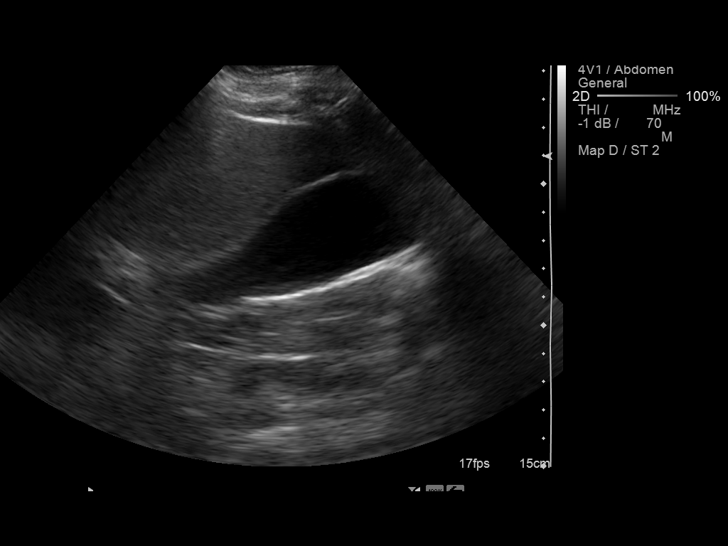
[im 20/39]
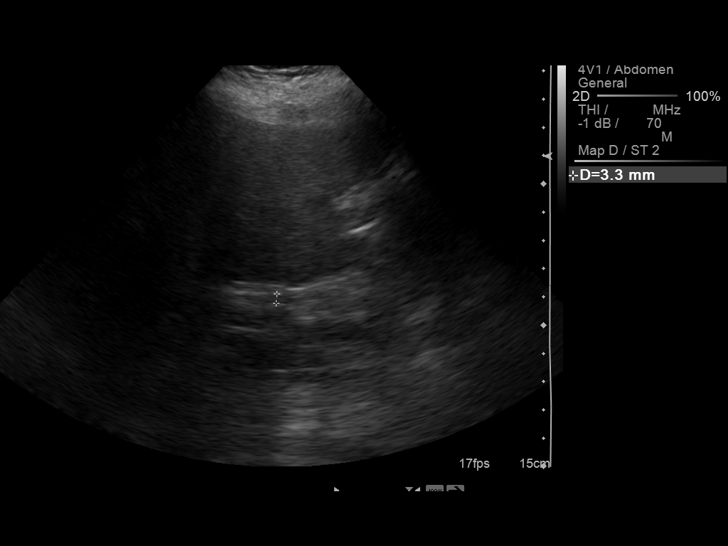
[im 23/39]
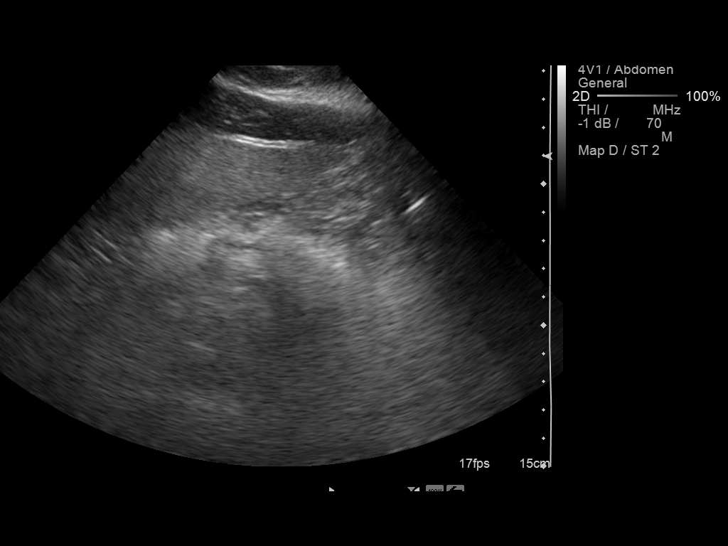
[im 26/39]
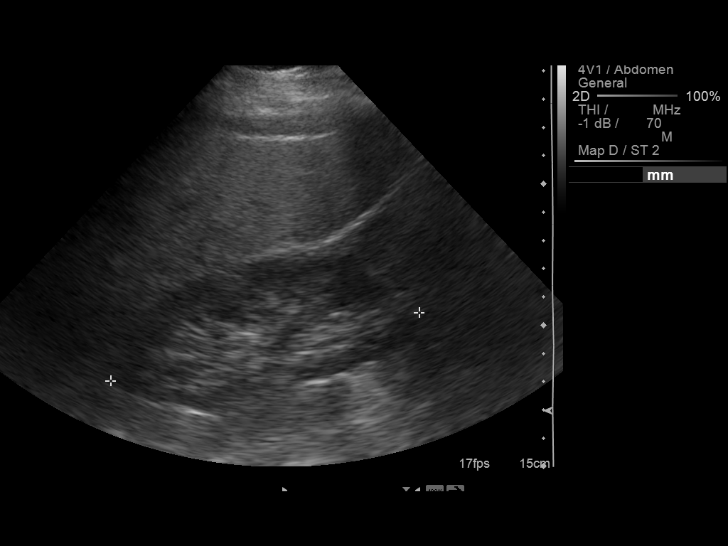
[im 29/39]
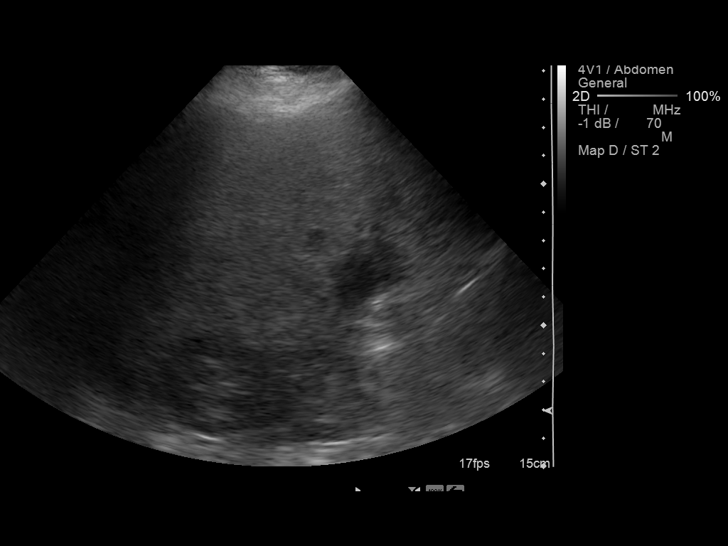
[im 32/39]
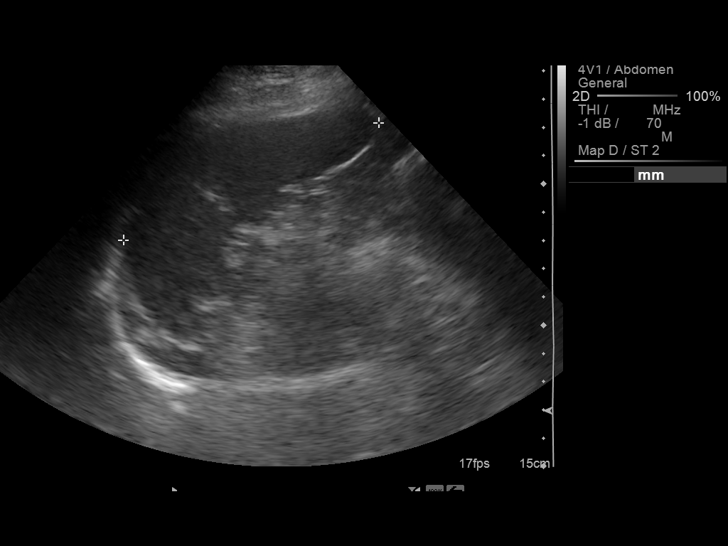
[im 35/39]
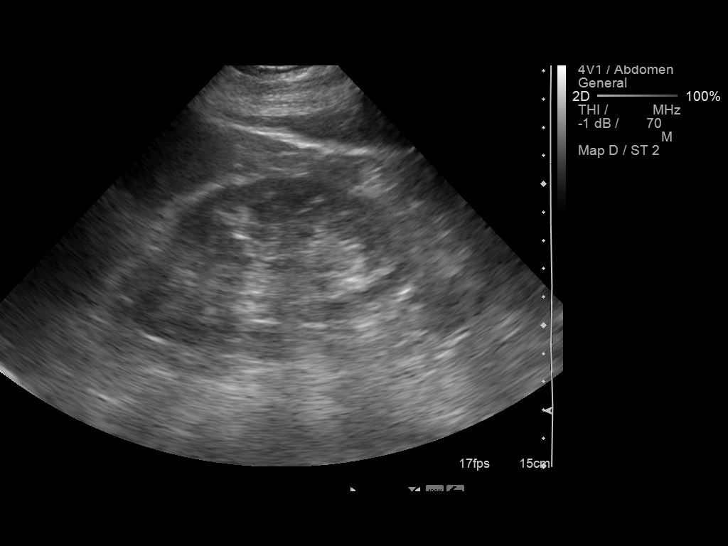
[im 39/39]
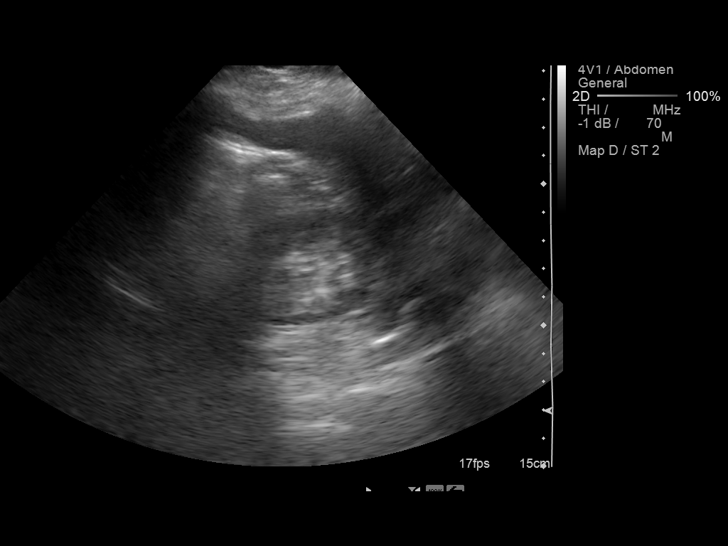

[13 of 25 positions shown; findings below may reference images not displayed]

FINDINGS: Gallbladder:  The gallbladder is normal in appearance, without
evidence for gallstones, gallbladder wall thickening or
pericholecystic fluid.  No ultrasonographic Murphy's sign is
elicited.

Common Bile Duct:  0.3 cm in diameter; within normal limits in
caliber.

Liver:  Diffusely increased echogenicity and coarsened echotexture,
compatible with fatty infiltration; no focal lesions identified.
Limited Doppler evaluation demonstrates normal blood flow within
the liver.  Difficult to fully characterize due to overlying
structures.

IVC:  Unremarkable in appearance.

Pancreas:  Although the pancreas is difficult to visualize in its
entirety due to overlying bowel gas, no focal pancreatic
abnormality is identified.

Spleen:  9.9 cm in length; within normal limits in size and
echotexture.

Right kidney:  11.2 cm in length; normal in size, configuration and
parenchymal echogenicity.  Mild renal cortical thinning is noted.
No evidence of mass or hydronephrosis.

Left kidney:  12.2 cm in length; normal in size, configuration and
parenchymal echogenicity.  No evidence of mass or hydronephrosis.

Abdominal Aorta:  Normal in caliber; no aneurysm identified.
IMPRESSION: 1.  No acute abnormality seen within the abdomen.
2.  Fatty infiltration within the liver.
3.  Mild right renal cortical thinning may reflect chronic renal
disease.

## 2016-01-08 IMAGING — CR DG CHEST 2V
2 series · 2 of 2 positions shown · non-contrast
Comparison: February 02, 2007.

CLINICAL DATA: Preop total knee replacement.

EXAM:
CHEST  2 VIEW

[w chest pa]
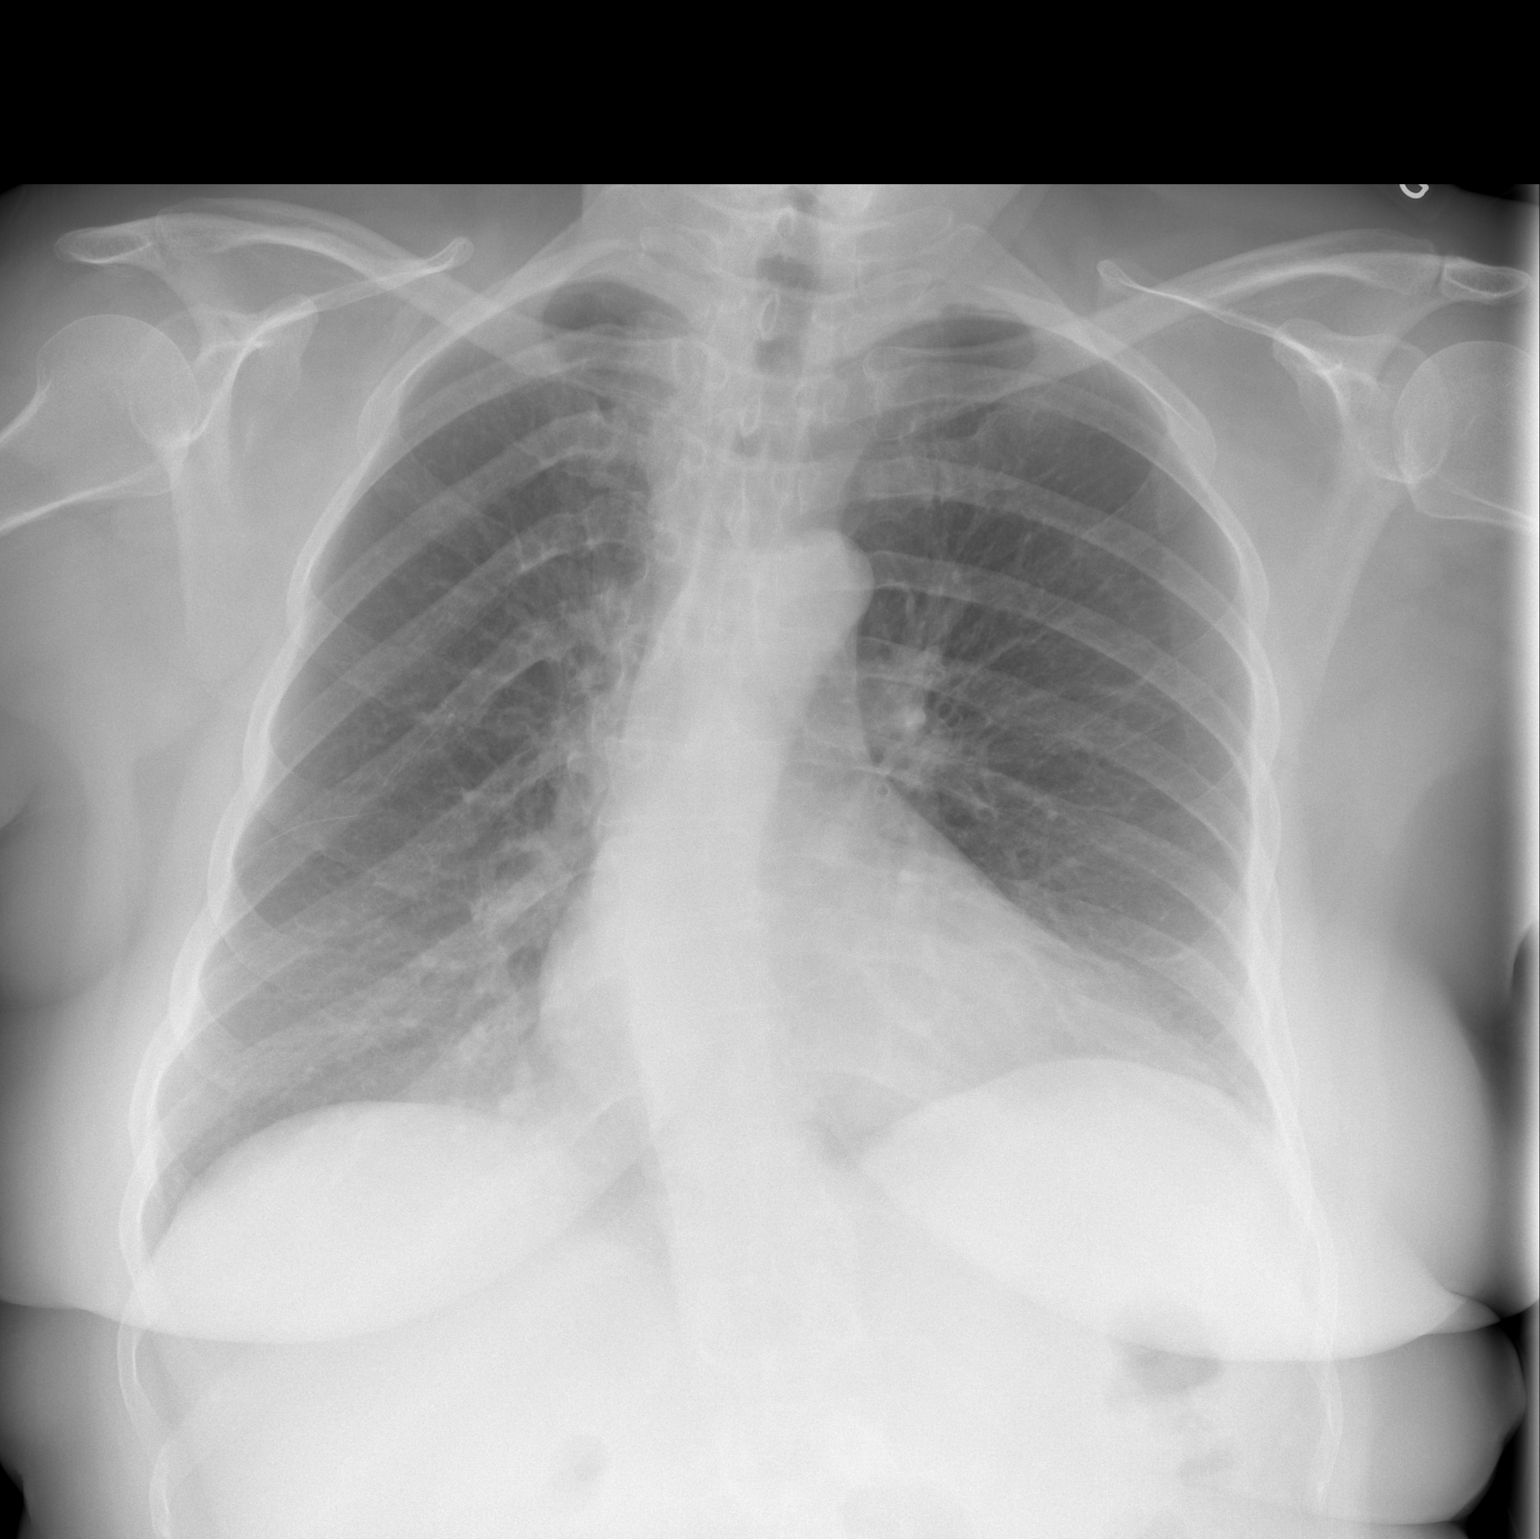

[w chest lat]
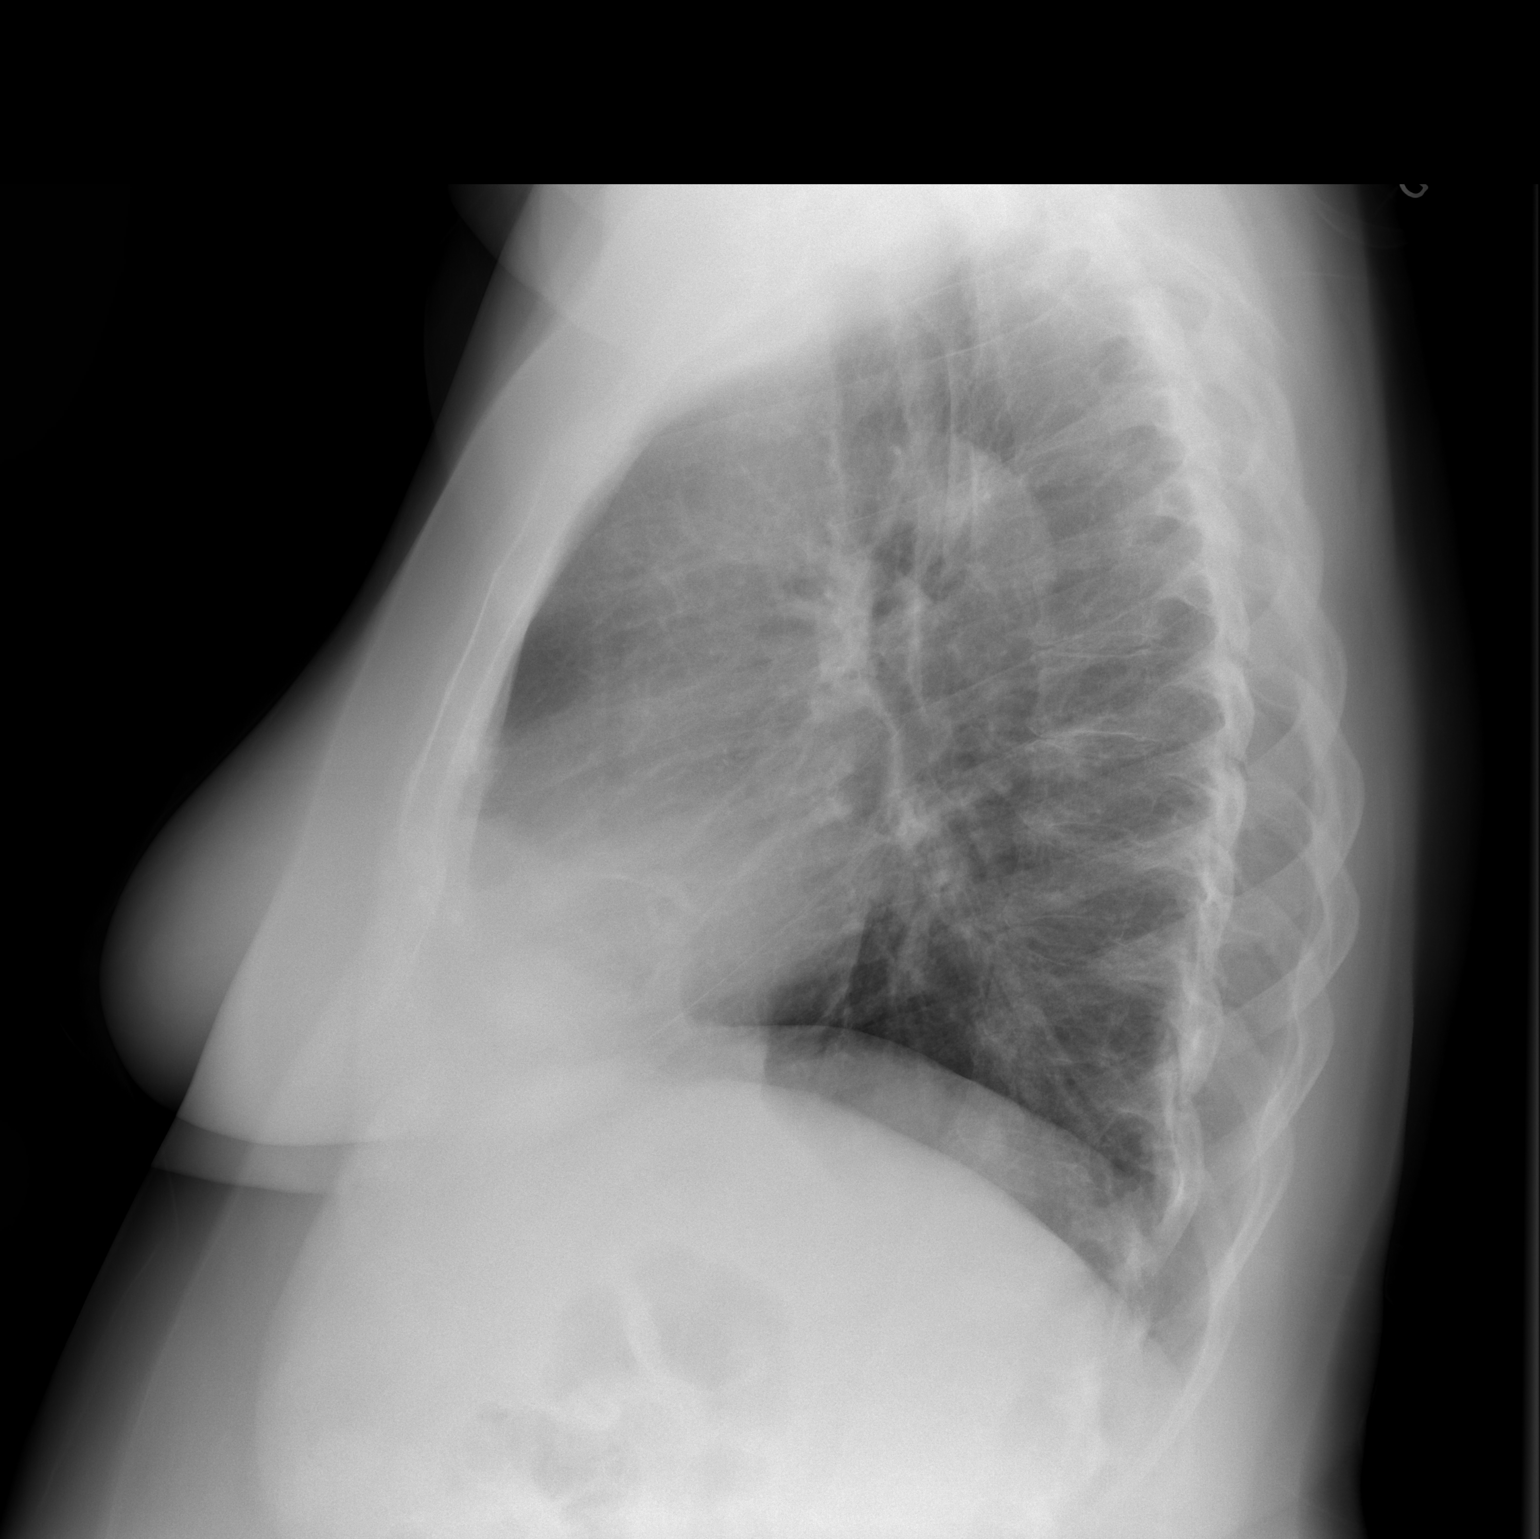

[2 of 2 positions shown; findings below may reference images not displayed]

FINDINGS: The heart size and mediastinal contours are within normal limits.
Both lungs are clear. Stable moderate dextroscoliosis of thoracic
spine.
IMPRESSION: No acute cardiopulmonary abnormality seen.

## 2016-01-14 IMAGING — CR DG CHEST 1V PORT
1 series · 1 of 1 positions shown · non-contrast
Comparison: DG CHEST 2 VIEW dated 08/19/2013

CLINICAL DATA: Decreased oxygen saturation, dyspnea, and wheezing.

EXAM:
PORTABLE CHEST - 1 VIEW

[AP]
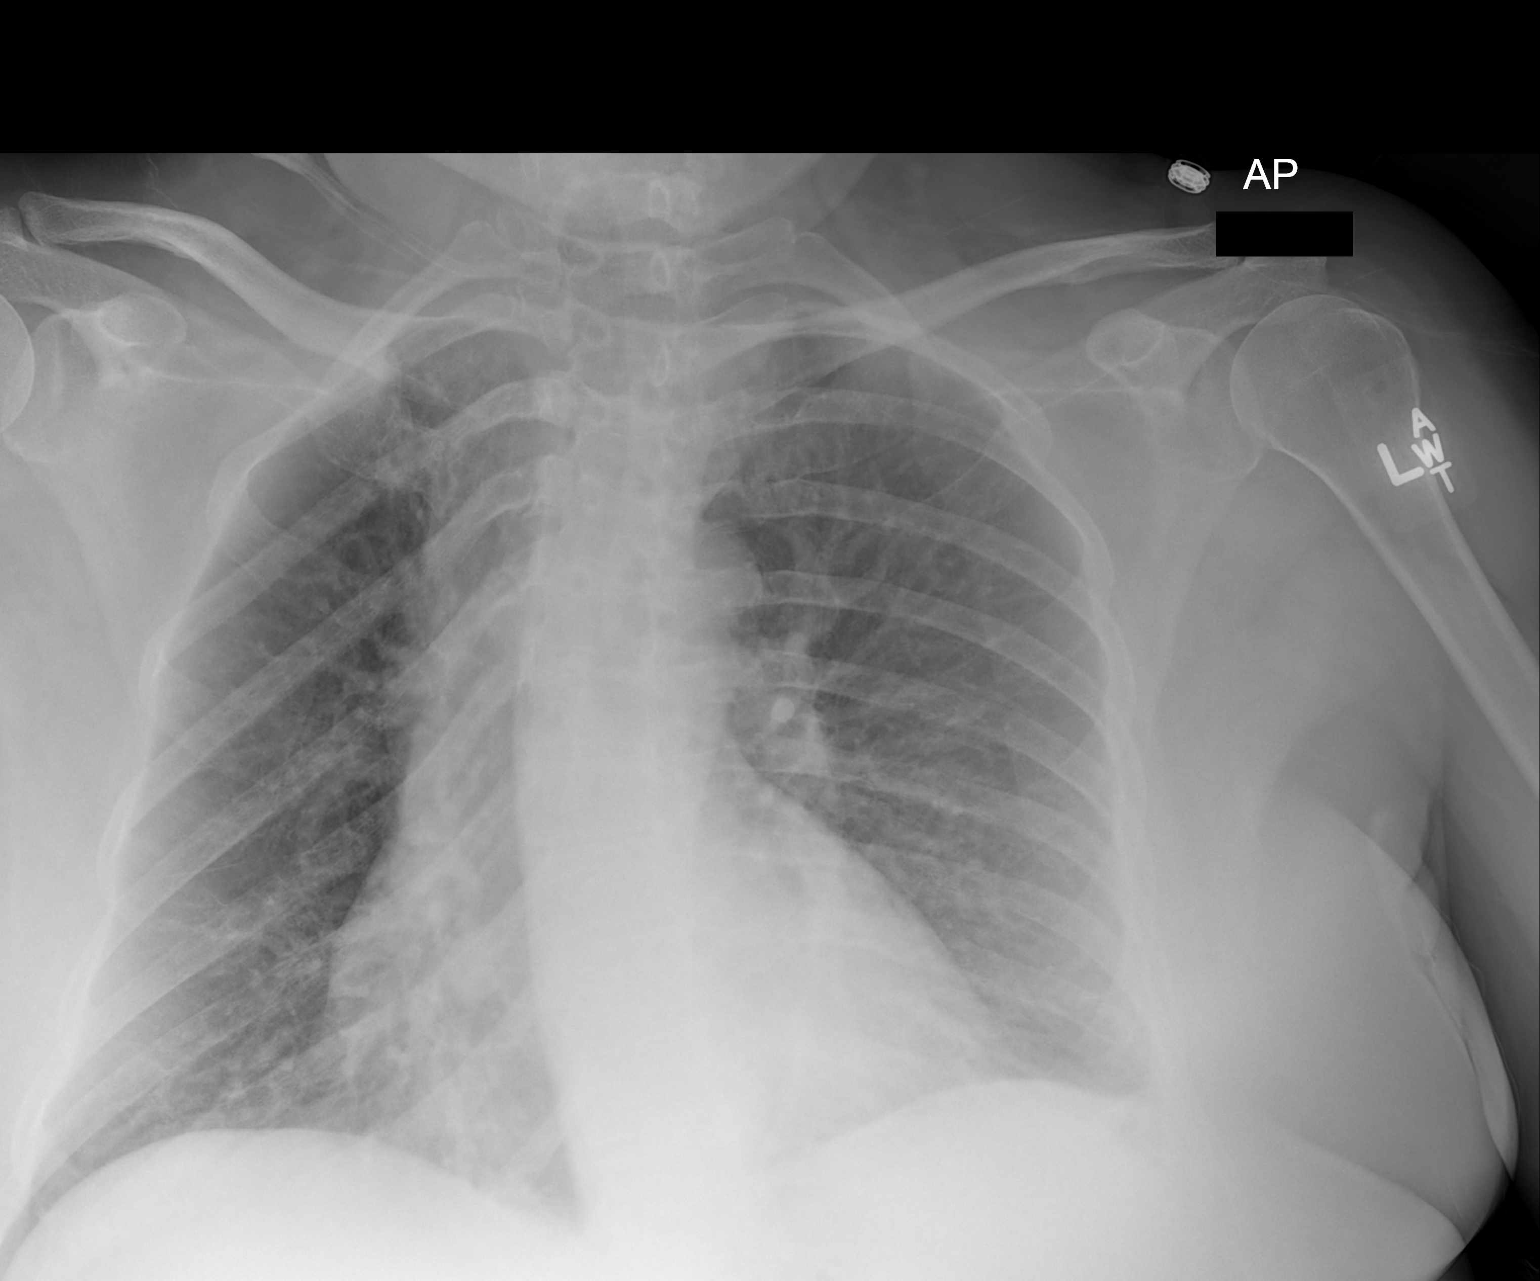

[1 of 1 positions shown; findings below may reference images not displayed]

FINDINGS: The lungs are well-expanded. There is no focal infiltrate. There is
minimal blunting of the left lateral costophrenic angle. The
pulmonary interstitial markings are mildly increased. The
cardiopericardial silhouette is mildly enlarged. The central
pulmonary vascularity is prominent.
IMPRESSION: The findings suggest low-grade CHF with mild interstitial edema.
Alternatively interstitial edema of other etiology may be present.
No alveolar pneumonia is demonstrated.
